# Patient Record
Sex: Female | Born: 1990 | Race: Black or African American | Hispanic: No | Marital: Single | Smoking: Current every day smoker
Health system: Southern US, Community
[De-identification: ages and names within clinical notes are randomized; demographics above are authoritative.]

## PROBLEM LIST (undated history)

## (undated) ENCOUNTER — Inpatient Hospital Stay (HOSPITAL_COMMUNITY): Payer: Self-pay

## (undated) ENCOUNTER — Ambulatory Visit

## (undated) DIAGNOSIS — B999 Unspecified infectious disease: Secondary | ICD-10-CM

## (undated) DIAGNOSIS — Z98891 History of uterine scar from previous surgery: Secondary | ICD-10-CM

## (undated) DIAGNOSIS — S3991XA Unspecified injury of abdomen, initial encounter: Secondary | ICD-10-CM

## (undated) DIAGNOSIS — A749 Chlamydial infection, unspecified: Secondary | ICD-10-CM

## (undated) DIAGNOSIS — N83209 Unspecified ovarian cyst, unspecified side: Secondary | ICD-10-CM

## (undated) DIAGNOSIS — Z3490 Encounter for supervision of normal pregnancy, unspecified, unspecified trimester: Secondary | ICD-10-CM

## (undated) HISTORY — PX: NO PAST SURGERIES: SHX2092

---
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
## 2010-06-02 ENCOUNTER — Ambulatory Visit: Payer: Self-pay | Admitting: Gynecology

## 2010-06-09 ENCOUNTER — Ambulatory Visit: Payer: Self-pay | Admitting: Gynecology

## 2010-09-13 ENCOUNTER — Ambulatory Visit: Payer: Self-pay | Admitting: Gynecology

## 2010-09-14 ENCOUNTER — Ambulatory Visit: Payer: Self-pay | Admitting: Gynecology

## 2010-09-20 ENCOUNTER — Ambulatory Visit: Payer: Self-pay | Admitting: Gynecology

## 2010-12-10 ENCOUNTER — Ambulatory Visit
Admission: RE | Admit: 2010-12-10 | Discharge: 2010-12-10 | Payer: Self-pay | Source: Home / Self Care | Attending: Gynecology | Admitting: Gynecology

## 2010-12-22 ENCOUNTER — Ambulatory Visit: Admit: 2010-12-22 | Payer: Self-pay | Admitting: Gynecology

## 2011-02-08 ENCOUNTER — Ambulatory Visit (INDEPENDENT_AMBULATORY_CARE_PROVIDER_SITE_OTHER): Payer: BC Managed Care – PPO | Admitting: Gynecology

## 2011-02-08 DIAGNOSIS — B373 Candidiasis of vulva and vagina: Secondary | ICD-10-CM

## 2011-02-08 DIAGNOSIS — Z113 Encounter for screening for infections with a predominantly sexual mode of transmission: Secondary | ICD-10-CM

## 2011-02-08 DIAGNOSIS — N949 Unspecified condition associated with female genital organs and menstrual cycle: Secondary | ICD-10-CM

## 2011-02-08 DIAGNOSIS — N898 Other specified noninflammatory disorders of vagina: Secondary | ICD-10-CM

## 2011-05-02 ENCOUNTER — Ambulatory Visit (INDEPENDENT_AMBULATORY_CARE_PROVIDER_SITE_OTHER): Payer: BC Managed Care – PPO | Admitting: Gynecology

## 2011-05-02 DIAGNOSIS — Z113 Encounter for screening for infections with a predominantly sexual mode of transmission: Secondary | ICD-10-CM

## 2011-05-02 DIAGNOSIS — N949 Unspecified condition associated with female genital organs and menstrual cycle: Secondary | ICD-10-CM

## 2011-05-02 DIAGNOSIS — Z3041 Encounter for surveillance of contraceptive pills: Secondary | ICD-10-CM

## 2011-05-03 ENCOUNTER — Ambulatory Visit (INDEPENDENT_AMBULATORY_CARE_PROVIDER_SITE_OTHER): Payer: BC Managed Care – PPO | Admitting: Gynecology

## 2011-05-03 ENCOUNTER — Other Ambulatory Visit: Payer: BC Managed Care – PPO

## 2011-05-03 DIAGNOSIS — N949 Unspecified condition associated with female genital organs and menstrual cycle: Secondary | ICD-10-CM

## 2011-05-03 DIAGNOSIS — N831 Corpus luteum cyst of ovary, unspecified side: Secondary | ICD-10-CM

## 2011-07-15 ENCOUNTER — Telehealth: Payer: Self-pay | Admitting: *Deleted

## 2011-07-15 DIAGNOSIS — N946 Dysmenorrhea, unspecified: Secondary | ICD-10-CM

## 2011-07-15 MED ORDER — TRAMADOL HCL 50 MG PO TABS: 50.0000 mg | ORAL_TABLET | Freq: Four times a day (QID) | ORAL | Status: AC | PRN

## 2011-07-15 NOTE — Telephone Encounter (Signed)
We can try Ultram 50 mg every 6 hours #20. If her pain persists and this doesn't help that she needs to proceed with laparoscopy and make an appointment to discuss this.

## 2011-07-15 NOTE — Telephone Encounter (Signed)
PT INFORMED WITH THE BELOW NOTE RE;RX AND LAPAROSCOPY AND TO MAKE APPOINTMENT TO DISCUSS.

## 2011-07-15 NOTE — Telephone Encounter (Signed)
PT C/O THAT MOTRIN 800 MG IS NOT HELPING WITH CRAMPING. LMP;TODAY. PT STATES CRAMPS ARE VERY BAD WITH PERIOD. SHE IS TAKING HER BCP AS PRESCRIBED. PLEASE ADVISE.

## 2011-07-15 NOTE — Telephone Encounter (Signed)
PT STATES THAT THE 800 MG MOTRIN NOT STRONG ENOUGH FOR CRAMPS.

## 2011-08-09 ENCOUNTER — Telehealth: Payer: Self-pay | Admitting: *Deleted

## 2011-08-09 DIAGNOSIS — IMO0001 Reserved for inherently not codable concepts without codable children: Secondary | ICD-10-CM

## 2011-08-09 MED ORDER — NORETHIN ACE-ETH ESTRAD-FE 1-20 MG-MCG PO TABS: 1.0000 | ORAL_TABLET | Freq: Every day | ORAL | Status: DC

## 2011-08-09 NOTE — Telephone Encounter (Signed)
PT CALLED WANTING LOESTRIN 1/20 BCP SENT TO [facility] [address]. RATHER THAT [facility]. RX SENT TO [facility]

## 2011-08-26 ENCOUNTER — Telehealth: Payer: Self-pay | Admitting: *Deleted

## 2011-08-26 NOTE — Telephone Encounter (Signed)
Pt complaint of breast tenderness from 3 row of birth control pills. Pt informed that this is normal. Pt will follow up if this should continue after her period starts.

## 2011-08-30 LAB — URINE MICROSCOPIC-ADD ON

## 2011-08-30 LAB — HERPES SIMPLEX VIRUS CULTURE: Culture: NOT DETECTED

## 2011-08-30 LAB — URINALYSIS, ROUTINE W REFLEX MICROSCOPIC
Bilirubin Urine: NEGATIVE
Glucose, UA: NEGATIVE
Ketones, ur: NEGATIVE
Nitrite: NEGATIVE
Protein, ur: NEGATIVE
Specific Gravity, Urine: 1.034 — ABNORMAL HIGH
Urobilinogen, UA: 1
pH: 7

## 2011-08-30 LAB — WET PREP, GENITAL
Clue Cells Wet Prep HPF POC: NONE SEEN
Trich, Wet Prep: NONE SEEN

## 2011-08-30 LAB — GC/CHLAMYDIA PROBE AMP, GENITAL
Chlamydia, DNA Probe: POSITIVE — AB
GC Probe Amp, Genital: NEGATIVE

## 2011-08-30 LAB — PREGNANCY, URINE: Preg Test, Ur: NEGATIVE

## 2011-09-02 LAB — WET PREP, GENITAL: Clue Cells Wet Prep HPF POC: NONE SEEN

## 2011-09-02 LAB — URINALYSIS, ROUTINE W REFLEX MICROSCOPIC
Glucose, UA: NEGATIVE mg/dL
Ketones, ur: NEGATIVE mg/dL
Protein, ur: NEGATIVE mg/dL
Urobilinogen, UA: 0.2 mg/dL (ref 0.0–1.0)

## 2011-09-02 LAB — GC/CHLAMYDIA PROBE AMP, GENITAL: Chlamydia, DNA Probe: NEGATIVE

## 2011-09-02 LAB — URINE MICROSCOPIC-ADD ON

## 2011-11-03 ENCOUNTER — Telehealth: Payer: Self-pay | Admitting: *Deleted

## 2011-11-03 ENCOUNTER — Encounter: Payer: Self-pay | Admitting: Gynecology

## 2011-11-03 ENCOUNTER — Ambulatory Visit (INDEPENDENT_AMBULATORY_CARE_PROVIDER_SITE_OTHER): Payer: BC Managed Care – PPO | Admitting: Gynecology

## 2011-11-03 VITALS — BP 112/64

## 2011-11-03 DIAGNOSIS — N912 Amenorrhea, unspecified: Secondary | ICD-10-CM

## 2011-11-03 NOTE — Telephone Encounter (Signed)
Pt informed with the below note. 

## 2011-11-03 NOTE — Patient Instructions (Signed)
Started on a prenatal vitamin. Establish care with an obstetrician in [place] over the next 2-3 weeks.

## 2011-11-03 NOTE — Telephone Encounter (Signed)
Pt called stating she took a pregnancy test yesterday and results were positive,pt period is 5 days late. She also noted having bad cramping at night only x 5 days on her left side. She going to schedule OV but would like to know if she could have ultrasound as well? Due to the pelvic pain. Please advise.

## 2011-11-03 NOTE — Progress Notes (Signed)
Patient presents approximately [redacted] weeks gestation positive home pregnancy test. No bleeding or significant cramping.  Exam with chaperone present Abdomen soft nontender without masses guarding rebound organomegaly. Pelvic external BUS vagina normal, cervix normal, uterus soft, nontender anteverted approximately 5 weeks size, adnexa without masses or tenderness  Assessment and plan: IUP approximately 5 weeks. Patient is not on multivitamins and I strongly urge her to start a prenatal vitamin. She understands we do not do obstetrics and she will need to establish care with obstetrician in [place] and I recommend she do so over the next 2-3 weeks.

## 2011-11-03 NOTE — Telephone Encounter (Signed)
Office visit first and then we'll decide about ultrasound as far as to need to do it as well as when we need to do it. If you do it too early you'll not see anything inside the uterus so it has to be done at the appropriate time.

## 2011-11-14 ENCOUNTER — Telehealth: Payer: Self-pay | Admitting: *Deleted

## 2011-11-14 NOTE — Telephone Encounter (Signed)
(  pt last ov 11/03/11) Pt called is [redacted] weeks pregnant and is having some tingling in her right arm starting today for about last 30 mins? No other complains only tingling. Pt couldn't get in to her OB/GYN office yet. Should pt stop by urgent care to be seen?

## 2011-11-14 NOTE — Telephone Encounter (Signed)
Pt informed with the below note. 

## 2011-11-14 NOTE — Telephone Encounter (Signed)
I would recommend that she call the obstetrician that she is choosing, let them know what is going on and see if they cannot see her sooner.

## 2011-12-01 ENCOUNTER — Inpatient Hospital Stay (HOSPITAL_COMMUNITY)
Admission: AD | Admit: 2011-12-01 | Discharge: 2011-12-01 | Disposition: A | Discharge status: Home / Self Care | Payer: BC Managed Care – PPO | Source: Ambulatory Visit | Attending: Obstetrics and Gynecology | Admitting: Obstetrics and Gynecology

## 2011-12-01 ENCOUNTER — Inpatient Hospital Stay (HOSPITAL_COMMUNITY): Payer: BC Managed Care – PPO

## 2011-12-01 ENCOUNTER — Encounter (HOSPITAL_COMMUNITY): Payer: Self-pay

## 2011-12-01 DIAGNOSIS — N760 Acute vaginitis: Secondary | ICD-10-CM | POA: Insufficient documentation

## 2011-12-01 DIAGNOSIS — O26899 Other specified pregnancy related conditions, unspecified trimester: Secondary | ICD-10-CM

## 2011-12-01 DIAGNOSIS — B9689 Other specified bacterial agents as the cause of diseases classified elsewhere: Secondary | ICD-10-CM | POA: Insufficient documentation

## 2011-12-01 DIAGNOSIS — R109 Unspecified abdominal pain: Secondary | ICD-10-CM | POA: Insufficient documentation

## 2011-12-01 DIAGNOSIS — O2390 Unspecified genitourinary tract infection in pregnancy, unspecified trimester: Secondary | ICD-10-CM | POA: Insufficient documentation

## 2011-12-01 DIAGNOSIS — A499 Bacterial infection, unspecified: Secondary | ICD-10-CM | POA: Insufficient documentation

## 2011-12-01 DIAGNOSIS — K5900 Constipation, unspecified: Secondary | ICD-10-CM | POA: Insufficient documentation

## 2011-12-01 HISTORY — DX: Unspecified ovarian cyst, unspecified side: N83.209

## 2011-12-01 LAB — CBC
Hemoglobin: 12.8 g/dL (ref 12.0–15.0)
MCH: 30.6 pg (ref 26.0–34.0)
MCHC: 35 g/dL (ref 30.0–36.0)
RDW: 12.6 % (ref 11.5–15.5)

## 2011-12-01 LAB — URINALYSIS, ROUTINE W REFLEX MICROSCOPIC
Bilirubin Urine: NEGATIVE
Glucose, UA: NEGATIVE mg/dL
Ketones, ur: NEGATIVE mg/dL
Leukocytes, UA: NEGATIVE
Nitrite: NEGATIVE
Protein, ur: NEGATIVE mg/dL
Specific Gravity, Urine: 1.015 (ref 1.005–1.030)
Urobilinogen, UA: 0.2 mg/dL (ref 0.0–1.0)
pH: 6 (ref 5.0–8.0)

## 2011-12-01 LAB — WET PREP, GENITAL
Trich, Wet Prep: NONE SEEN
Yeast Wet Prep HPF POC: NONE SEEN

## 2011-12-01 LAB — URINE MICROSCOPIC-ADD ON

## 2011-12-01 LAB — HCG, QUANTITATIVE, PREGNANCY: hCG, Beta Chain, Quant, S: 122461 m[IU]/mL — ABNORMAL HIGH (ref <5)

## 2011-12-01 MED ORDER — METRONIDAZOLE 500 MG PO TABS: 500.0000 mg | ORAL_TABLET | Freq: Two times a day (BID) | ORAL | Status: AC

## 2011-12-01 NOTE — Progress Notes (Signed)
Pt states lower abd pain over 2 weeks. Vaginal discharge noted

## 2011-12-01 NOTE — Progress Notes (Signed)
Patient states she has been having abdominal pain on both sides at the mid level for over 2 weeks.Pain is worse at night.  Was seen at Urgent Care at that time but pain continues. Patient states she has been having a clear vaginal discharge with an odor since she found out she was pregnant. Had a positive pregnancy test with Dr. Marva Panda office and states he had diagnosed an ovarian cyst on the left side a while ago.

## 2011-12-01 NOTE — ED Provider Notes (Signed)
History     Chief Complaint  Patient presents with  . Abdominal Pain  . Vaginal Discharge   HPI  Pt is pregnant -9weeks by LMP 09/29/2011 - with history of irregular periods.  She has a history of a cyst.  She has previously seen Dr. Audie Box in her early pregnancy, but has not pursued OB care yet.  She is to see Dr. Gaynell Face.  She complains of lower abdominal pain worse on her left side and worse at night.  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  She has had some nausea and vomiting.  She has not taken any medication for the pain or nausea. She also complains of constipation but had a soft bowel movement yesterday.  She does not use the bathroom everyday. She was seen at Urgent care 2 weeks ago for the abdominal pain, but she states they did not do an evaluation.  She complains of a vaginal discharge with an odor, that she noticed before she was pregnant.  No past medical history on file.  No past surgical history on file.  Family History  Problem Relation Age of Onset  . Diabetes Paternal Grandmother     History  Substance Use Topics  . Smoking status: Never Smoker   . Smokeless tobacco: Not on file  . Alcohol Use: No    Allergies: No Known Allergies  Prescriptions prior to admission  Medication Sig Dispense Refill  . norethindrone-ethinyl estradiol (JUNEL FE,GILDESS FE,LOESTRIN FE) 1-20 MG-MCG tablet Take 1 tablet by mouth daily.  28 Package  4    Review of Systems  Constitutional: Negative for fever and chills.  HENT:       Denies blurred vision- occur bilateral temporal  Gastrointestinal: Positive for nausea, vomiting, abdominal pain and constipation. Negative for diarrhea.  Genitourinary: Negative for dysuria.  Neurological: Positive for headaches.   Physical Exam   Blood pressure 124/69, pulse 86, temperature 99 F (37.2 C), temperature source Oral, resp. rate 16, height 5' (1.524 m), weight 147 lb 3.2 oz (66.769 kg), last menstrual period  09/29/2011, SpO2 99.00%.  Physical Exam  Vitals reviewed. Constitutional: She is oriented to person, place, and time. She appears well-developed and well-nourished.  HENT:  Head: Normocephalic.  Eyes: Pupils are equal, round, and reactive to light.  Neck: Normal range of motion. Neck supple.  Cardiovascular: Normal rate.   Respiratory: Effort normal.  GI: Soft. She exhibits no distension. There is no tenderness. There is no rebound and no guarding.  Genitourinary:       Small amount of frothy white discharge in vault; cervix clean NT, uterus 8-10 week size nontender; adnexal without palpable enlargement or tenderness  Musculoskeletal: Normal range of motion.  Neurological: She is alert and oriented to person, place, and time.  Skin: Skin is warm and dry.  Psychiatric: She has a normal mood and affect.    MAU Course  Procedures Results for orders placed during the hospital encounter of 12/01/11 (from the past 24 hour(s))  URINALYSIS, ROUTINE W REFLEX MICROSCOPIC     Status: Abnormal   Collection Time   12/01/11  9:34 AM      Component Value Range   Color, Urine YELLOW  YELLOW    APPearance CLEAR  CLEAR    Specific Gravity, Urine 1.015  1.005 - 1.030    pH 6.0  5.0 - 8.0    Glucose, UA NEGATIVE  NEGATIVE (mg/dL)   Hgb urine dipstick TRACE (*) NEGATIVE  Bilirubin Urine NEGATIVE  NEGATIVE    Ketones, ur NEGATIVE  NEGATIVE (mg/dL)   Protein, ur NEGATIVE  NEGATIVE (mg/dL)   Urobilinogen, UA 0.2  0.0 - 1.0 (mg/dL)   Nitrite NEGATIVE  NEGATIVE    Leukocytes, UA NEGATIVE  NEGATIVE   URINE MICROSCOPIC-ADD ON     Status: Normal   Collection Time   12/01/11  9:34 AM      Component Value Range   Squamous Epithelial / LPF RARE  RARE    WBC, UA 0-2  <3 (WBC/hpf)   RBC / HPF 0-2  <3 (RBC/hpf)   Bacteria, UA RARE  RARE   CBC     Status: Normal   Collection Time   12/01/11  9:48 AM      Component Value Range   WBC 8.3  4.0 - 10.5 (K/uL)   RBC 4.18  3.87 - 5.11 (MIL/uL)   Hemoglobin  12.8  12.0 - 15.0 (g/dL)   HCT 09.8  11.9 - 14.7 (%)   MCV 87.6  78.0 - 100.0 (fL)   MCH 30.6  26.0 - 34.0 (pg)   MCHC 35.0  30.0 - 36.0 (g/dL)   RDW 82.9  56.2 - 13.0 (%)   Platelets 264  150 - 400 (K/uL)  HCG, QUANTITATIVE, PREGNANCY     Status: Abnormal   Collection Time   12/01/11  9:50 AM      Component Value Range   hCG, Beta Chain, Quant, S [id number] (*) <5 (mIU/mL)  WET PREP, GENITAL     Status: Abnormal   Collection Time   12/01/11 11:52 AM      Component Value Range   Yeast, Wet Prep NONE SEEN  NONE SEEN    Trich, Wet Prep NONE SEEN  NONE SEEN    Clue Cells, Wet Prep FEW (*) NONE SEEN    WBC, Wet Prep HPF POC FEW (*) NONE SEEN   ultrasound showed single living IUP demonstrating on EGA by CRL of 103w3d.  This correlates well with expected EGA by LMP of [redacted]w[redacted]d.  Normal ovaries    Assessment and Plan  Abdominal pain in pregnancy Constipation BV- will give prescription for Flagyl F/u with OB care  Sherel Fennell 12/01/2011, 9:51 AM

## 2011-12-02 LAB — GC/CHLAMYDIA PROBE AMP, GENITAL
Chlamydia, DNA Probe: NEGATIVE
GC Probe Amp, Genital: NEGATIVE

## 2011-12-05 NOTE — ED Provider Notes (Signed)
Agree with above note.  Katherine Robbins 12/05/2011 10:04 AM   

## 2011-12-29 LAB — OB RESULTS CONSOLE ANTIBODY SCREEN: Antibody Screen: NEGATIVE

## 2011-12-29 LAB — OB RESULTS CONSOLE ABO/RH: RH Type: NEGATIVE

## 2011-12-29 LAB — OB RESULTS CONSOLE RPR: RPR: NONREACTIVE

## 2011-12-29 LAB — OB RESULTS CONSOLE RUBELLA ANTIBODY, IGM: Rubella: IMMUNE

## 2011-12-29 LAB — OB RESULTS CONSOLE VARICELLA ZOSTER ANTIBODY, IGG: Varicella: IMMUNE

## 2011-12-29 LAB — OB RESULTS CONSOLE HEPATITIS B SURFACE ANTIGEN: Hepatitis B Surface Ag: NEGATIVE

## 2011-12-29 LAB — OB RESULTS CONSOLE HIV ANTIBODY (ROUTINE TESTING): HIV: NONREACTIVE

## 2011-12-30 ENCOUNTER — Other Ambulatory Visit (HOSPITAL_COMMUNITY): Payer: Self-pay | Admitting: Obstetrics and Gynecology

## 2011-12-30 DIAGNOSIS — Z3682 Encounter for antenatal screening for nuchal translucency: Secondary | ICD-10-CM

## 2012-01-02 ENCOUNTER — Other Ambulatory Visit: Payer: Self-pay

## 2012-01-02 ENCOUNTER — Ambulatory Visit (HOSPITAL_COMMUNITY)
Admission: RE | Admit: 2012-01-02 | Discharge: 2012-01-02 | Disposition: A | Discharge status: Home / Self Care | Payer: Medicaid Other | Source: Ambulatory Visit | Attending: Obstetrics and Gynecology | Admitting: Obstetrics and Gynecology

## 2012-01-02 VITALS — BP 116/74 | HR 97 | Wt 150.0 lb

## 2012-01-02 DIAGNOSIS — O351XX0 Maternal care for (suspected) chromosomal abnormality in fetus, not applicable or unspecified: Secondary | ICD-10-CM | POA: Insufficient documentation

## 2012-01-02 DIAGNOSIS — Z3682 Encounter for antenatal screening for nuchal translucency: Secondary | ICD-10-CM

## 2012-01-02 DIAGNOSIS — O3510X0 Maternal care for (suspected) chromosomal abnormality in fetus, unspecified, not applicable or unspecified: Secondary | ICD-10-CM | POA: Insufficient documentation

## 2012-01-02 DIAGNOSIS — Z3689 Encounter for other specified antenatal screening: Secondary | ICD-10-CM | POA: Insufficient documentation

## 2012-01-02 NOTE — Progress Notes (Signed)
Obstetric ultrasound performed today.  Nuchal translucency measurements reassuring.  Please see report in [facility].

## 2012-01-28 ENCOUNTER — Inpatient Hospital Stay (HOSPITAL_COMMUNITY)
Admission: AD | Admit: 2012-01-28 | Discharge: 2012-01-28 | Disposition: A | Discharge status: Home / Self Care | Payer: Medicaid Other | Source: Ambulatory Visit | Attending: Obstetrics and Gynecology | Admitting: Obstetrics and Gynecology

## 2012-01-28 ENCOUNTER — Encounter (HOSPITAL_COMMUNITY): Payer: Self-pay | Admitting: Obstetrics and Gynecology

## 2012-01-28 DIAGNOSIS — O99891 Other specified diseases and conditions complicating pregnancy: Secondary | ICD-10-CM | POA: Insufficient documentation

## 2012-01-28 DIAGNOSIS — N949 Unspecified condition associated with female genital organs and menstrual cycle: Secondary | ICD-10-CM

## 2012-01-28 DIAGNOSIS — R109 Unspecified abdominal pain: Secondary | ICD-10-CM | POA: Insufficient documentation

## 2012-01-28 LAB — CBC
HCT: 35.6 % — ABNORMAL LOW (ref 36.0–46.0)
Hemoglobin: 12.1 g/dL (ref 12.0–15.0)
MCH: 30.2 pg (ref 26.0–34.0)
RBC: 4.01 MIL/uL (ref 3.87–5.11)

## 2012-01-28 LAB — URINALYSIS, ROUTINE W REFLEX MICROSCOPIC
Bilirubin Urine: NEGATIVE
Glucose, UA: NEGATIVE mg/dL
Leukocytes, UA: NEGATIVE
Nitrite: NEGATIVE
Specific Gravity, Urine: 1.015 (ref 1.005–1.030)
pH: 7 (ref 5.0–8.0)

## 2012-01-28 MED ORDER — COMFORT FIT MATERNITY SUPP MED MISC: 1.0000 | Freq: Every day | Status: DC

## 2012-01-28 NOTE — Progress Notes (Signed)
Lower abdominal pain x 2 weeks has been hurting all night, G1  17 weeks.

## 2012-01-28 NOTE — ED Provider Notes (Signed)
Jonathon Resides y.o.G1P0000 @[redacted]w[redacted]d  Chief Complaint  Patient presents with  . Abdominal Pain    SUBJECTIVE  HPI: Pt presents with abdominal pain, indicated as suprapubic and left lateral abdominal. She has had left sided pain throughout pregnancy but pain was worse last night, making it difficult to sleep.  Pain is described as constant pain that worsens a couple of times per hour.  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  She denies vaginal bleeding, LOF, vaginal itching/discharge, fever/chills, h/a, dizziness, or n/v.    Past Medical History  Diagnosis Date  . Ovarian cyst    Past Surgical History  Procedure Date  . No past surgeries    History   Social History  . Marital Status: Single    Spouse Name: N/A    Number of Children: N/A  . Years of Education: N/A   Occupational History  . Not on file.   Social History Main Topics  . Smoking status: Never Smoker   . Smokeless tobacco: Never Used  . Alcohol Use: No  . Drug Use: No  . Sexually Active: Yes    Birth Control/ Protection: None   Other Topics Concern  . Not on file   Social History Narrative  . No narrative on file   No current facility-administered medications on file prior to encounter.   Current Outpatient Prescriptions on File Prior to Encounter  Medication Sig Dispense Refill  . PRENATAL VITAMINS PO Take by mouth.       Allergies  Allergen Reactions  . Shellfish Allergy Itching and Swelling    ROS: Pertinent items in HPI  OBJECTIVE See intake temp and temp retake below: Temp:  [98.6 F (37 C)-100.1 F (37.8 C)] 98.7 F (37.1 C) (03/02 1100) Pulse Rate:  [95-104] 95  (03/02 1100) Resp:  [18] 18  (03/02 1100) BP: (117-121)/(70-76) 121/70 mmHg (03/02 1100) Weight:  [69.491 kg (153 lb 3.2 oz)] 69.491 kg (153 lb 3.2 oz) (03/02 0814)  GENERAL: Well-developed, well-nourished female in no acute distress.  LUNGS: Clear to auscultation  bilaterally.  HEART: Regular rate and rhythm. ABDOMEN: Soft, mildly tender in suprapubic and lower abdominal area and in left inguinal region EXTREMITIES: Nontender, no edema Pelvic exam: Deferred Bimanual exam: 0/long/high  Neg CVA tenderness Neg rebounding or guarding  FHR:151 by doppler CONTRACTIONS: None palpated  RESULTS Results for orders placed during the hospital encounter of 01/28/12 (from the past 24 hour(s))  URINALYSIS, ROUTINE W REFLEX MICROSCOPIC     Status: Abnormal   Collection Time   01/28/12  8:15 AM      Component Value Range   Color, Urine YELLOW  YELLOW    APPearance HAZY (*) CLEAR    Specific Gravity, Urine 1.015  1.005 - 1.030    pH 7.0  5.0 - 8.0    Glucose, UA NEGATIVE  NEGATIVE (mg/dL)   Hgb urine dipstick NEGATIVE  NEGATIVE    Bilirubin Urine NEGATIVE  NEGATIVE    Ketones, ur NEGATIVE  NEGATIVE (mg/dL)   Protein, ur NEGATIVE  NEGATIVE (mg/dL)   Urobilinogen, UA 0.2  0.0 - 1.0 (mg/dL)   Nitrite NEGATIVE  NEGATIVE    Leukocytes, UA NEGATIVE  NEGATIVE   CBC     Status: Abnormal   Collection Time   01/28/12  9:41 AM      Component Value Range   WBC 10.0  4.0 - 10.5 (K/uL)   RBC 4.01  3.87 - 5.11 (MIL/uL)  Hemoglobin 12.1  12.0 - 15.0 (g/dL)   HCT 40.9 (*) 81.1 - 46.0 (%)   MCV 88.8  78.0 - 100.0 (fL)   MCH 30.2  26.0 - 34.0 (pg)   MCHC 34.0  30.0 - 36.0 (g/dL)   RDW 91.4  78.2 - 95.6 (%)   Platelets 246  150 - 400 (K/uL)    IMAGING Not indicated  MANAGEMENT Called Dr Jackelyn Knife to review results.  He agrees with POC to d/c home with f/u this week.    ASSESSMENT Round ligament pain  PLAN D/C home  Prescription for maternity support belt F/U with Dr Jackelyn Knife on Monday Return to MAU as needed

## 2012-01-28 NOTE — Discharge Instructions (Signed)

## 2012-03-21 ENCOUNTER — Encounter (HOSPITAL_COMMUNITY): Payer: Self-pay | Admitting: *Deleted

## 2012-03-21 ENCOUNTER — Inpatient Hospital Stay (HOSPITAL_COMMUNITY)
Admission: AD | Admit: 2012-03-21 | Discharge: 2012-03-21 | Disposition: A | Discharge status: Home / Self Care | Payer: Medicaid Other | Source: Ambulatory Visit | Attending: Obstetrics and Gynecology | Admitting: Obstetrics and Gynecology

## 2012-03-21 DIAGNOSIS — O219 Vomiting of pregnancy, unspecified: Secondary | ICD-10-CM

## 2012-03-21 DIAGNOSIS — R197 Diarrhea, unspecified: Secondary | ICD-10-CM

## 2012-03-21 DIAGNOSIS — K5289 Other specified noninfective gastroenteritis and colitis: Secondary | ICD-10-CM | POA: Insufficient documentation

## 2012-03-21 DIAGNOSIS — O212 Late vomiting of pregnancy: Secondary | ICD-10-CM | POA: Insufficient documentation

## 2012-03-21 DIAGNOSIS — O99891 Other specified diseases and conditions complicating pregnancy: Secondary | ICD-10-CM | POA: Insufficient documentation

## 2012-03-21 LAB — URINALYSIS, ROUTINE W REFLEX MICROSCOPIC
Bilirubin Urine: NEGATIVE
Ketones, ur: 15 mg/dL — AB
Protein, ur: 100 mg/dL — AB
Specific Gravity, Urine: 1.025 (ref 1.005–1.030)
Urobilinogen, UA: 1 mg/dL (ref 0.0–1.0)

## 2012-03-21 LAB — COMPREHENSIVE METABOLIC PANEL
ALT: 16 U/L (ref 0–35)
AST: 39 U/L — ABNORMAL HIGH (ref 0–37)
CO2: 19 mEq/L (ref 19–32)
Chloride: 101 mEq/L (ref 96–112)
Creatinine, Ser: 0.46 mg/dL — ABNORMAL LOW (ref 0.50–1.10)
GFR calc Af Amer: >90 mL/min (ref >90)
GFR calc non Af Amer: >90 mL/min (ref >90)
Glucose, Bld: 89 mg/dL (ref 70–99)
Total Bilirubin: 0.3 mg/dL (ref 0.3–1.2)

## 2012-03-21 LAB — CBC
Hemoglobin: 12 g/dL (ref 12.0–15.0)
MCV: 91 fL (ref 78.0–100.0)
Platelets: 246 10*3/uL (ref 150–400)
RBC: 3.9 MIL/uL (ref 3.87–5.11)
WBC: 16.9 10*3/uL — ABNORMAL HIGH (ref 4.0–10.5)

## 2012-03-21 LAB — URINE MICROSCOPIC-ADD ON

## 2012-03-21 MED ORDER — ONDANSETRON 8 MG PO TBDP: 8.0000 mg | ORAL_TABLET | Freq: Three times a day (TID) | ORAL | Status: AC | PRN

## 2012-03-21 MED ORDER — PROMETHAZINE HCL 25 MG/ML IJ SOLN
12.5000 mg | Freq: Once | INTRAMUSCULAR | Status: AC
  Administered 2012-03-21: 12.5 mg via INTRAVENOUS
  Filled 2012-03-21: qty 1

## 2012-03-21 MED ORDER — LACTATED RINGERS IV BOLUS (SEPSIS)
1000.0000 mL | Freq: Once | INTRAVENOUS | Status: AC
  Administered 2012-03-21: 1000 mL via INTRAVENOUS

## 2012-03-21 MED ORDER — PROMETHAZINE HCL 25 MG PO TABS: 25.0000 mg | ORAL_TABLET | Freq: Four times a day (QID) | ORAL | Status: DC | PRN

## 2012-03-21 MED ORDER — ONDANSETRON HCL 4 MG/2ML IJ SOLN
4.0000 mg | Freq: Once | INTRAMUSCULAR | Status: AC
  Administered 2012-03-21: 4 mg via INTRAVENOUS
  Filled 2012-03-21: qty 2

## 2012-03-21 NOTE — MAU Note (Signed)
Feeling better, still slightly nauseated, no adverse effect from phenergan

## 2012-03-21 NOTE — MAU Provider Note (Signed)
History     CSN: [id number]  Arrival date and time: 03/21/12 0536   None     No chief complaint on file.  HPI 21 y.o. G1P0000 at [redacted]w[redacted]d with n/v since 2200 last night, 6 episodes, not able to keep anything down overnight, 1 episode of diarrhea. Uncomplicated prenatal course so far, except for "really bad heartburn". C/O abdominal tightening "every once in a while" since onset of vomiting, no bleeding or discharge.    Past Medical History  Diagnosis Date  . Ovarian cyst     Past Surgical History  Procedure Date  . No past surgeries     Family History  Problem Relation Age of Onset  . Diabetes Paternal Grandmother     History  Substance Use Topics  . Smoking status: Never Smoker   . Smokeless tobacco: Never Used  . Alcohol Use: No    Allergies:  Allergies  Allergen Reactions  . Shellfish Allergy Itching and Swelling    Prescriptions prior to admission  Medication Sig Dispense Refill  . Elastic Bandages & Supports (COMFORT FIT MATERNITY SUPP MED) MISC 1 Device by Does not apply route daily.  1 each  0  . PRENATAL VITAMINS PO Take by mouth.        Review of Systems  Constitutional: Negative.   Respiratory: Negative.   Cardiovascular: Negative.   Gastrointestinal: Positive for nausea, vomiting, abdominal pain and diarrhea. Negative for constipation.  Genitourinary: Negative for dysuria, urgency, frequency, hematuria and flank pain.       Negative for vaginal bleeding, cramping/contractions  Musculoskeletal: Negative.   Neurological: Negative.   Psychiatric/Behavioral: Negative.    Physical Exam   Last menstrual period 09/29/2011.  Physical Exam  Nursing note and vitals reviewed. Constitutional: She is oriented to person, place, and time. She appears well-developed and well-nourished. No distress.  Cardiovascular: Normal rate.   Respiratory: Effort normal.  GI: Soft. She exhibits no mass. There is no tenderness. There is no rebound and no guarding.    Musculoskeletal: Normal range of motion.  Neurological: She is alert and oriented to person, place, and time.  Skin: Skin is warm and dry.  Psychiatric: She has a normal mood and affect.   EFM: reassuring at 25 weeks, TOCO: quiet  MAU Course  Procedures  Results for orders placed during the hospital encounter of 03/21/12 (from the past 24 hour(s))  URINALYSIS, ROUTINE W REFLEX MICROSCOPIC     Status: Abnormal   Collection Time   03/21/12  5:45 AM      Component Value Range   Color, Urine YELLOW  YELLOW    APPearance CLEAR  CLEAR    Specific Gravity, Urine 1.025  1.005 - 1.030    pH 6.0  5.0 - 8.0    Glucose, UA NEGATIVE  NEGATIVE (mg/dL)   Hgb urine dipstick TRACE (*) NEGATIVE    Bilirubin Urine NEGATIVE  NEGATIVE    Ketones, ur 15 (*) NEGATIVE (mg/dL)   Protein, ur 657 (*) NEGATIVE (mg/dL)   Urobilinogen, UA 1.0  0.0 - 1.0 (mg/dL)   Nitrite NEGATIVE  NEGATIVE    Leukocytes, UA NEGATIVE  NEGATIVE   URINE MICROSCOPIC-ADD ON     Status: Normal   Collection Time   03/21/12  5:45 AM      Component Value Range   Squamous Epithelial / LPF RARE  RARE    WBC, UA 0-2  <3 (WBC/hpf)   Bacteria, UA RARE  RARE    Urine-Other MUCOUS PRESENT  IV hydration and Zofran 4 mg IV given, pt continues to c/o nausea and vomiting, Phenergan 12.5 mg IV given and 2nd liter of fluid hung.   Assessment and Plan  21 y.o. G1P0000 at [redacted]w[redacted]d Gastroenteritis - IV hydration and antiemetics Dr. Jackelyn Knife aware, may send home after hydration with rx for antiemetics if stable Care assumed by Charlestine Night at 0800  Plan Feeling much better after fluids and ready to go home BRAT diet today. rx Phenergan 25 mg One po q 4-6 hours (#25)  rx Zofran 8 mg ODT one sublingual q 8 hours PRN for nausea Call your doctor if you have questions or concerns   FRAZIER,NATALIE 03/21/2012, 5:37 AM

## 2012-03-21 NOTE — Progress Notes (Signed)
Labs drawn and sent -

## 2012-03-21 NOTE — MAU Note (Signed)
Pt states she  Has been vomiting since 2200 last night-vomited approx 6 times

## 2012-03-21 NOTE — Discharge Instructions (Signed)
Clear liquids until no vomiting then BRAT diet today and advance slowly as tolerated No fried foods today. Call your doctor if you have any questions or concerns. Keep your appointments as scheduled in the office.

## 2012-04-03 ENCOUNTER — Institutional Professional Consult (permissible substitution): Payer: BC Managed Care – PPO | Admitting: Pediatrics

## 2012-04-06 ENCOUNTER — Encounter (HOSPITAL_COMMUNITY): Payer: Self-pay | Admitting: *Deleted

## 2012-04-06 ENCOUNTER — Inpatient Hospital Stay (HOSPITAL_COMMUNITY)
Admission: AD | Admit: 2012-04-06 | Discharge: 2012-04-06 | Disposition: A | Discharge status: Home / Self Care | Payer: Medicaid Other | Source: Ambulatory Visit | Attending: Obstetrics and Gynecology | Admitting: Obstetrics and Gynecology

## 2012-04-06 DIAGNOSIS — O479 False labor, unspecified: Secondary | ICD-10-CM

## 2012-04-06 DIAGNOSIS — R1084 Generalized abdominal pain: Secondary | ICD-10-CM

## 2012-04-06 DIAGNOSIS — R109 Unspecified abdominal pain: Secondary | ICD-10-CM | POA: Insufficient documentation

## 2012-04-06 DIAGNOSIS — O99891 Other specified diseases and conditions complicating pregnancy: Secondary | ICD-10-CM | POA: Insufficient documentation

## 2012-04-06 DIAGNOSIS — O26899 Other specified pregnancy related conditions, unspecified trimester: Secondary | ICD-10-CM

## 2012-04-06 DIAGNOSIS — R143 Flatulence: Secondary | ICD-10-CM

## 2012-04-06 DIAGNOSIS — R141 Gas pain: Secondary | ICD-10-CM

## 2012-04-06 LAB — URINALYSIS, ROUTINE W REFLEX MICROSCOPIC
Bilirubin Urine: NEGATIVE
Hgb urine dipstick: NEGATIVE
Protein, ur: NEGATIVE mg/dL
Urobilinogen, UA: 0.2 mg/dL (ref 0.0–1.0)

## 2012-04-06 LAB — WET PREP, GENITAL
Clue Cells Wet Prep HPF POC: NONE SEEN
Trich, Wet Prep: NONE SEEN

## 2012-04-06 MED ORDER — GI COCKTAIL ~~LOC~~
30.0000 mL | Freq: Once | ORAL | Status: AC
  Administered 2012-04-06: 30 mL via ORAL
  Filled 2012-04-06: qty 30

## 2012-04-06 NOTE — MAU Provider Note (Signed)
Chief Complaint:  Abdominal Pain    First Provider Initiated Contact with Patient 04/06/12 1210      Katherine Robbins is  20 y.o. G1P0000.  Patient's last menstrual period was 09/29/2011..  [redacted]w[redacted]d   She presents complaining of Abdominal Pain . Onset is described as intermittent and has been present for  1 days. States pain is sharp and shooting in upper, mid-abd. Reports she notices it once every 30 minutes.  Obstetrical/Gynecological History: OB History    Grav Para Term Preterm Abortions TAB SAB Ect Mult Living   1 0 0 0 0 0 0 0 0 0       Past Medical History: Past Medical History  Diagnosis Date  . Ovarian cyst     Past Surgical History: Past Surgical History  Procedure Date  . No past surgeries     Family History: Family History  Problem Relation Age of Onset  . Diabetes Paternal Grandmother   . Anesthesia problems Neg Hx     Social History: History  Substance Use Topics  . Smoking status: Never Smoker   . Smokeless tobacco: Never Used  . Alcohol Use: No    Allergies:  Allergies  Allergen Reactions  . Shellfish Allergy Itching and Swelling    Prescriptions prior to admission  Medication Sig Dispense Refill  . calcium carbonate (TUMS EX) 750 MG chewable tablet Chew 2 tablets by mouth as needed. Used for heartburn.      . diphenhydrAMINE (BENADRYL) 25 mg capsule Take 25 mg by mouth as needed. Used for allergies.      . Elastic Bandages & Supports (COMFORT FIT MATERNITY SUPP MED) MISC 1 Device by Does not apply route daily.  1 each  0  . PRENATAL VITAMINS PO Take by mouth.      . promethazine (PHENERGAN) 25 MG tablet Take 1 tablet (25 mg total) by mouth every 6 (six) hours as needed for nausea. May take 1/2 tablet for mild symptoms.  Sedation precautions  30 tablet  0    Review of Systems - History obtained from chart review Respiratory ROS: no cough, shortness of breath, or wheezing Cardiovascular ROS: no chest pain or dyspnea on  exertion Gastrointestinal ROS: positive for - constipation and gas/bloating Genito-Urinary ROS: no dysuria, trouble voiding, or hematuria, no vaginal bleeding, + white/creamy discharge  Physical Exam   Blood pressure 92/56, pulse 104, temperature 97.9 F (36.6 C), temperature source Oral, resp. rate 18, height 5' (1.524 m), weight 160 lb (72.576 kg), last menstrual period 09/29/2011.  General: General appearance - alert, well appearing, and in no distress, oriented to person, place, and time and overweight Mental status - alert, oriented to person, place, and time, normal mood, behavior, speech, dress, motor activity, and thought processes, affect appropriate to mood Chest - clear to auscultation, no wheezes, rales or rhonchi, symmetric air entry Heart - normal rate, regular rhythm, normal S1, S2, no murmurs, rubs, clicks or gallops Abdomen - gravid, non tender Focused Gynecological Exam: cervix: closed/thick/firm FHT: Cat I tracing Toco: no contracions  Labs: Recent Results (from the past 24 hour(s))  URINALYSIS, ROUTINE W REFLEX MICROSCOPIC   Collection Time   04/06/12 11:45 AM      Component Value Range   Color, Urine YELLOW  YELLOW    APPearance HAZY (*) CLEAR    Specific Gravity, Urine 1.010  1.005 - 1.030    pH 7.5  5.0 - 8.0    Glucose, UA NEGATIVE  NEGATIVE (mg/dL)   Hgb  urine dipstick NEGATIVE  NEGATIVE    Bilirubin Urine NEGATIVE  NEGATIVE    Ketones, ur NEGATIVE  NEGATIVE (mg/dL)   Protein, ur NEGATIVE  NEGATIVE (mg/dL)   Urobilinogen, UA 0.2  0.0 - 1.0 (mg/dL)   Nitrite NEGATIVE  NEGATIVE    Leukocytes, UA TRACE (*) NEGATIVE   URINE MICROSCOPIC-ADD ON   Collection Time   04/06/12 11:45 AM      Component Value Range   Squamous Epithelial / LPF MANY (*) RARE    WBC, UA 3-6  <3 (WBC/hpf)   Bacteria, UA MANY (*) RARE   WET PREP, GENITAL   Collection Time   04/06/12 12:18 PM      Component Value Range   Yeast Wet Prep HPF POC NONE SEEN  NONE SEEN    Trich, Wet Prep  NONE SEEN  NONE SEEN    Clue Cells Wet Prep HPF POC NONE SEEN  NONE SEEN    WBC, Wet Prep HPF POC FEW (*) NONE SEEN    MD Consult: Discussed with Dr. Ellyn Hack, agrees with plan. 12:50 PM   Assessment: 1. Gas pain   2. Pain of round ligament complicating pregnancy, antepartum      Plan: Discharge home FU as scheduled in office  Valene Villa E. 04/06/2012,12:46 PM

## 2012-04-06 NOTE — MAU Note (Signed)
Pt in c/o sharp mid abdominal pains that radiate from bottom to top of abdomen.  Reports a vaginal discharge that is white and odorous.  Denies any bleeding.  + FM.

## 2012-04-06 NOTE — Discharge Instructions (Signed)
Bloating Bloating is the feeling of fullness in your belly. You may feel as though your pants are too tight. Often the cause of bloating is overeating, retaining fluids, or having gas in your bowel. It is also caused by swallowing air and eating foods that cause gas. Irritable bowel syndrome is one of the most common causes of bloating. Constipation is also a common cause. Sometimes more serious problems can cause bloating. SYMPTOMS  Usually there is a feeling of fullness, as though your abdomen is bulged out. There may be mild discomfort.  DIAGNOSIS  Usually no particular testing is necessary for most bloating. If the condition persists and seems to become worse, your caregiver may do additional testing.  TREATMENT   There is no direct treatment for bloating.   Do not put gas into the bowel. Avoid chewing gum and sucking on candy. These tend to make you swallow air. Swallowing air can also be a nervous habit. Try to avoid this.   Avoiding high residue diets will help. Eat foods with soluble fibers (examples include root vegetables, apples, or barley) and substitute dairy products with soy and rice products. This helps irritable bowel syndrome.   If constipation is the cause, then a high residue diet with more fiber will help.   Avoid carbonated beverages.   Over-the-counter preparations are available that help reduce gas. Your pharmacist can help you with this.  SEEK MEDICAL CARE IF:   Bloating continues and seems to be getting worse.   You notice a weight gain.   You have a weight loss but the bloating is getting worse.   You have changes in your bowel habits or develop nausea or vomiting.  SEEK IMMEDIATE MEDICAL CARE IF:   You develop shortness of breath or swelling in your legs.   You have an increase in abdominal pain or develop chest pain.  Document Released: 09/14/2006 Document Revised: 11/03/2011 Document Reviewed: 11/02/2007 [facility] Patient Information 2012 [place],  [place].Preterm Labor Preterm labor is when labor starts at less than 37 weeks of pregnancy. The normal length of a pregnancy is 39 to 41 weeks. CAUSES Often, there is no identifiable underlying cause as to why a woman goes into preterm labor. However, one of the most common known causes of preterm labor is infection. Infections of the uterus, cervix, vagina, amniotic sac, bladder, kidney, or even the lungs (pneumonia) can cause labor to start. Other causes of preterm labor include:  Urogenital infections, such as yeast infections and bacterial vaginosis.   Uterine abnormalities (uterine shape, uterine septum, fibroids, bleeding from the placenta).   A cervix that has been operated on and opens prematurely.   Malformations in the baby.   Multiple gestations (twins, triplets, and so on).   Breakage of the amniotic sac.  Additional risk factors for preterm labor include:  Previous history of preterm labor.   Premature rupture of membranes (PROM).   A placenta that covers the opening of the cervix (placenta previa).   A placenta that separates from the uterus (placenta abruption).   A cervix that is too weak to hold the baby in the uterus (incompetence cervix).   Having too much fluid in the amniotic sac (polyhydramnios).   Taking illegal drugs or smoking while pregnant.   Not gaining enough weight while pregnant.   Women younger than 76 and older than 21 years old.   Low socioeconomic status.   African-American ethnicity.  SYMPTOMS Signs and symptoms of preterm labor include:  Menstrual-like cramps.  Contractions that are 30 to 70 seconds apart, become very regular, closer together, and are more intense and painful.   Contractions that start on the top of the uterus and spread down to the lower abdomen and back.   A sense of increased pelvic pressure or back pain.   A watery or bloody discharge that comes from the vagina.  DIAGNOSIS  A diagnosis can be confirmed  by:  A vaginal exam.   An ultrasound of the cervix.   Sampling (swabbing) cervico-vaginal secretions. These samples can be tested for the presence of fetal fibronectin. This is a protein found in cervical discharge which is associated with preterm labor.   Fetal monitoring.  TREATMENT  Depending on the length of the pregnancy and other circumstances, a caregiver may suggest bed rest. If necessary, there are medicines that can be given to stop contractions and to quicken fetal lung maturity. If labor happens before 34 weeks of pregnancy, a prolonged hospital stay may be recommended. Treatment depends on the condition of both the mother and baby. PREVENTION There are some things a mother can do to lower the risk of preterm labor in future pregnancies. A woman can:   Stop smoking.   Maintain healthy weight gain and avoid chemicals and drugs that are not necessary.   Be watchful for any type of infection.   Inform her caregiver if she has a known history of preterm labor.  Document Released: 02/04/2004 Document Revised: 11/03/2011 Document Reviewed: 03/11/2011 [facility] Patient Information 2012 [place], [place].Round Ligament Pain The round ligament is made up of muscle and fibrous tissue. It is attached to the uterus near the fallopian tube. The round ligament is located on both sides of the uterus and helps support the position of the uterus. It usually begins in the second trimester of pregnancy when the uterus comes out of the pelvis. The pain can come and go until the baby is delivered. Round ligament pain is not a serious problem and does not cause harm to the baby. CAUSE During pregnancy the uterus grows the most from the second trimester to delivery. As it grows, it stretches and slightly twists the round ligaments. When the uterus leans from one side to the other, the round ligament on the opposite side pulls and stretches. This can cause pain. SYMPTOMS  Pain can occur on one side or  both sides. The pain is usually a short, sharp, and pinching-like. Sometimes it can be a dull, lingering and aching pain. The pain is located in the lower side of the abdomen or in the groin. The pain is internal and usually starts deep in the groin and moves up to the outside of the hip area. Pain can occur with:  Sudden change in position like getting out of bed or a chair.   Rolling over in bed.   Coughing or sneezing.   Walking too much.   Any type of physical activity.  DIAGNOSIS  Your caregiver will make sure there are no serious problems causing the pain. When nothing serious is found, the symptoms usually indicate that the pain is from the round ligament. TREATMENT   Sit down and relax when the pain starts.   Flex your knees up to your belly.   Lay on your side with a pillow under your belly (abdomen) and another one between your legs.   Sit in a hot bath for 15 to 20 minutes or until the pain goes away.  HOME CARE INSTRUCTIONS   Only  take over-the-counter or prescriptions medicines for pain, discomfort or fever as directed by your caregiver.   Sit and stand slowly.   Avoid long walks if it causes pain.   Stop or lessen your physical activities if it causes pain.  SEEK MEDICAL CARE IF:   The pain does not go away with any of your treatment.   You need stronger medication for the pain.   You develop back pain that you did not have before with the side pain.  SEEK IMMEDIATE MEDICAL CARE IF:   You develop a temperature of 102 F (38.9 C) or higher.   You develop uterine contractions.   You develop vaginal bleeding.   You develop nausea, vomiting or diarrhea.   You develop chills.   You have pain when you urinate.  Document Released: 08/23/2008 Document Revised: 11/03/2011 Document Reviewed: 08/23/2008 [facility] Patient Information 2012 [place], [place].

## 2012-04-13 LAB — OB RESULTS CONSOLE RPR: RPR: NONREACTIVE

## 2012-05-26 ENCOUNTER — Encounter (HOSPITAL_COMMUNITY): Payer: Self-pay | Admitting: *Deleted

## 2012-05-26 ENCOUNTER — Inpatient Hospital Stay (HOSPITAL_COMMUNITY)
Admission: AD | Admit: 2012-05-26 | Discharge: 2012-05-26 | Disposition: A | Discharge status: Home / Self Care | Payer: Medicaid Other | Source: Ambulatory Visit | Attending: Obstetrics and Gynecology | Admitting: Obstetrics and Gynecology

## 2012-05-26 DIAGNOSIS — O212 Late vomiting of pregnancy: Secondary | ICD-10-CM | POA: Insufficient documentation

## 2012-05-26 DIAGNOSIS — M25473 Effusion, unspecified ankle: Secondary | ICD-10-CM

## 2012-05-26 DIAGNOSIS — R209 Unspecified disturbances of skin sensation: Secondary | ICD-10-CM | POA: Insufficient documentation

## 2012-05-26 DIAGNOSIS — IMO0002 Reserved for concepts with insufficient information to code with codable children: Secondary | ICD-10-CM | POA: Insufficient documentation

## 2012-05-26 DIAGNOSIS — O219 Vomiting of pregnancy, unspecified: Secondary | ICD-10-CM

## 2012-05-26 LAB — URINALYSIS, ROUTINE W REFLEX MICROSCOPIC
Nitrite: NEGATIVE
Protein, ur: 30 mg/dL — AB
Urobilinogen, UA: 0.2 mg/dL (ref 0.0–1.0)

## 2012-05-26 LAB — URINE MICROSCOPIC-ADD ON

## 2012-05-26 MED ORDER — ONDANSETRON 8 MG PO TBDP
8.0000 mg | ORAL_TABLET | Freq: Once | ORAL | Status: AC
  Administered 2012-05-26: 8 mg via ORAL
  Filled 2012-05-26: qty 1

## 2012-05-26 NOTE — ED Notes (Signed)
Swelling in left foot is much decrease almost normal. Pt reports numbness is gone.

## 2012-05-26 NOTE — Discharge Instructions (Signed)
Drink at least 8 8-oz glasses of water every day. Take Tylenol 325 mg 2 tablets by mouth every 4 hours if needed for pain. Keep your appointments in the office. Call your doctor if your symptoms worsen.

## 2012-05-26 NOTE — MAU Note (Addendum)
Pt reports  having numbness in her left foot that started 30 min and reports abd cramping and  having vomiting since earlier this morning.

## 2012-05-26 NOTE — MAU Provider Note (Signed)
History     CSN: [id number]  Arrival date and time: 05/26/12 1307   First Provider Initiated Contact with Patient 05/26/12 1331      Chief Complaint  Patient presents with  . Numbness   HPI Katherine Robbins 21 y.o. [redacted]w[redacted]d  Comes to MAU with nausea and vomiting today.  Also, having numbness in left leg and pain in left toes for 30 minutes prior to coming to MAU.  Mother accompanies.  Had "stress" at home today.  Tearful and had been sitting on the steps outside before numbness began.  OB History    Grav Para Term Preterm Abortions TAB SAB Ect Mult Living   1 0 0 0 0 0 0 0 0 0       Past Medical History  Diagnosis Date  . Ovarian cyst     Past Surgical History  Procedure Date  . No past surgeries     Family History  Problem Relation Age of Onset  . Diabetes Paternal Grandmother   . Anesthesia problems Neg Hx     History  Substance Use Topics  . Smoking status: Never Smoker   . Smokeless tobacco: Never Used  . Alcohol Use: No    Allergies:  Allergies  Allergen Reactions  . Shellfish Allergy Itching and Swelling    Prescriptions prior to admission  Medication Sig Dispense Refill  . calcium carbonate (TUMS EX) 750 MG chewable tablet Chew 2 tablets by mouth as needed. Used for heartburn.      . Prenatal Vit-Fe Fumarate-FA (PRENATAL MULTIVITAMIN) TABS Take 1 tablet by mouth every morning.      Marland Kitchen PRENATAL VITAMINS PO Take by mouth.      . promethazine (PHENERGAN) 25 MG tablet Take 1 tablet (25 mg total) by mouth every 6 (six) hours as needed for nausea. May take 1/2 tablet for mild symptoms.  Sedation precautions  30 tablet  0    Review of Systems  Constitutional: Negative for fever.  Gastrointestinal: Positive for nausea, vomiting and abdominal pain. Negative for diarrhea and constipation.  Genitourinary: Negative for dysuria.       No vaginal discharge. No vaginal bleeding. No dysuria.  Neurological:       Numbness in left foot Pain in left toes    Physical Exam   Blood pressure 104/57, pulse 124, temperature 98.5 F (36.9 C), resp. rate 18, height 5' (1.524 m), weight 166 lb (75.297 kg), last menstrual period 09/29/2011.  Physical Exam  Nursing note and vitals reviewed. Constitutional: She is oriented to person, place, and time. She appears well-developed and well-nourished.  HENT:  Head: Normocephalic.  Eyes: EOM are normal.  Neck: Neck supple.  GI: Soft. There is tenderness. There is no rebound and no guarding.  Musculoskeletal: Normal range of motion.  Neurological: She is alert and oriented to person, place, and time.  Skin: Skin is warm and dry.  Psychiatric: She has a normal mood and affect.    MAU Course  Procedures Results for orders placed during the hospital encounter of 05/26/12 (from the past 24 hour(s))  URINALYSIS, ROUTINE W REFLEX MICROSCOPIC     Status: Abnormal   Collection Time   05/26/12  1:40 PM      Component Value Range   Color, Urine YELLOW  YELLOW   APPearance CLOUDY (*) CLEAR   Specific Gravity, Urine 1.020  1.005 - 1.030   pH 7.0  5.0 - 8.0   Glucose, UA NEGATIVE  NEGATIVE mg/dL   Hgb urine  dipstick NEGATIVE  NEGATIVE   Bilirubin Urine NEGATIVE  NEGATIVE   Ketones, ur NEGATIVE  NEGATIVE mg/dL   Protein, ur 30 (*) NEGATIVE mg/dL   Urobilinogen, UA 0.2  0.0 - 1.0 mg/dL   Nitrite NEGATIVE  NEGATIVE   Leukocytes, UA MODERATE (*) NEGATIVE  URINE MICROSCOPIC-ADD ON     Status: Abnormal   Collection Time   05/26/12  1:40 PM      Component Value Range   Squamous Epithelial / LPF MANY (*) RARE   WBC, UA 11-20  <3 WBC/hpf   RBC / HPF 0-2  <3 RBC/hpf   Bacteria, UA MANY (*) RARE   Crystals CA OXALATE CRYSTALS (*) NEGATIVE   Client feeling better after resting in bed and with mother out of the room.  Able to walk to give urine specimen.  Walked on side of her left foot, but by the time she was discharged, had shoes on and walked without assistatance.  Was given Zofran in MAU.  Eating burger and  fries before discharge.  Left with mother.  MDM Consult with Dr. Jackelyn Knife re: plan of care Urine culture sent  Assessment and Plan  Nausea and vomiting Ankle edema  Plan Drink at least 8 8-oz glasses of water every day.  Take Tylenol 325 mg 2 tablets by mouth every 4 hours if needed for pain.  Keep your appointments in the office.  Call your doctor if your symptoms worsen.  Katherine Robbins 05/26/2012, 1:43 PM

## 2012-05-27 LAB — URINE CULTURE: Colony Count: 35000

## 2012-05-30 NOTE — Progress Notes (Signed)
FHT from 6-29 reviewed.  Reactive NST, occasional variable decel, no regular ctx.

## 2012-06-07 LAB — OB RESULTS CONSOLE GBS: GBS: NEGATIVE

## 2012-06-25 ENCOUNTER — Observation Stay (HOSPITAL_COMMUNITY): Payer: Medicaid Other

## 2012-06-25 ENCOUNTER — Encounter (HOSPITAL_COMMUNITY): Payer: Self-pay | Admitting: *Deleted

## 2012-06-25 ENCOUNTER — Observation Stay (HOSPITAL_COMMUNITY)
Admission: AD | Admit: 2012-06-25 | Discharge: 2012-06-26 | DRG: 780 | Disposition: A | Discharge status: Home / Self Care | Payer: Medicaid Other | Source: Ambulatory Visit | Attending: Obstetrics and Gynecology | Admitting: Obstetrics and Gynecology

## 2012-06-25 DIAGNOSIS — W010XXA Fall on same level from slipping, tripping and stumbling without subsequent striking against object, initial encounter: Secondary | ICD-10-CM | POA: Diagnosis present

## 2012-06-25 DIAGNOSIS — S3991XA Unspecified injury of abdomen, initial encounter: Secondary | ICD-10-CM

## 2012-06-25 DIAGNOSIS — Z3490 Encounter for supervision of normal pregnancy, unspecified, unspecified trimester: Secondary | ICD-10-CM

## 2012-06-25 DIAGNOSIS — O479 False labor, unspecified: Principal | ICD-10-CM | POA: Diagnosis present

## 2012-06-25 HISTORY — DX: Encounter for supervision of normal pregnancy, unspecified, unspecified trimester: Z34.90

## 2012-06-25 HISTORY — DX: Unspecified injury of abdomen, initial encounter: S39.91XA

## 2012-06-25 HISTORY — DX: Chlamydial infection, unspecified: A74.9

## 2012-06-25 LAB — KLEIHAUER-BETKE STAIN: Fetal Cells %: 0.1 %

## 2012-06-25 MED ORDER — SODIUM CHLORIDE 0.9 % IJ SOLN
INTRAMUSCULAR | Status: AC
  Filled 2012-06-25: qty 3

## 2012-06-25 MED ORDER — ZOLPIDEM TARTRATE 5 MG PO TABS
5.0000 mg | ORAL_TABLET | Freq: Every evening | ORAL | Status: DC | PRN
  Administered 2012-06-25: 5 mg via ORAL
  Filled 2012-06-25: qty 1

## 2012-06-25 MED ORDER — LACTATED RINGERS IV SOLN
INTRAVENOUS | Status: DC
  Administered 2012-06-26: via INTRAVENOUS

## 2012-06-25 MED ORDER — ACETAMINOPHEN 325 MG PO TABS
650.0000 mg | ORAL_TABLET | ORAL | Status: DC | PRN
  Administered 2012-06-26: 650 mg via ORAL
  Filled 2012-06-25: qty 2

## 2012-06-25 MED ORDER — DOCUSATE SODIUM 100 MG PO CAPS
100.0000 mg | ORAL_CAPSULE | Freq: Every day | ORAL | Status: DC
  Administered 2012-06-26: 100 mg via ORAL
  Filled 2012-06-25: qty 1

## 2012-06-25 MED ORDER — PRENATAL MULTIVITAMIN CH
1.0000 | ORAL_TABLET | Freq: Every day | ORAL | Status: DC
  Administered 2012-06-26: 1 via ORAL
  Filled 2012-06-25: qty 1

## 2012-06-25 MED ORDER — RHO D IMMUNE GLOBULIN 1500 UNIT/2ML IJ SOLN
300.0000 ug | Freq: Once | INTRAMUSCULAR | Status: AC
  Administered 2012-06-26: 300 ug via INTRAVENOUS
  Filled 2012-06-25: qty 2

## 2012-06-25 MED ORDER — CALCIUM CARBONATE ANTACID 500 MG PO CHEW: 2.0000 | CHEWABLE_TABLET | ORAL | Status: DC | PRN

## 2012-06-25 NOTE — MAU Note (Signed)
Pt states she tripped and hit her abdomen on the corner of a desk.  She has a scratch on her belly, but denies pain, bleeding or leaking.

## 2012-06-25 NOTE — MAU Provider Note (Signed)
See H&P  Dorathy Kinsman 7/29/20136:18 PM

## 2012-06-25 NOTE — MAU Note (Signed)
Was feeling what felt like contractions initially, no longer feeling that or having pain.

## 2012-06-25 NOTE — MAU Note (Signed)
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  Baby has not been moving really since.

## 2012-06-25 NOTE — H&P (Addendum)
Katherine Robbins is a 21 y.o. female G1P0 at 38+ with abdominal trauma - fell down, hit abdomen on counter.  +FM, no LOF, no VB, occ ctx; Uncomplicated PNC. Maternal Medical History:  Contractions: Frequency: regular.   Perceived severity is moderate.    Fetal activity: Perceived fetal activity is normal.      OB History    Grav Para Term Preterm Abortions TAB SAB Ect Mult Living   1 0 0 0 0 0 0 0 0 0     G1 present, no abn pap, no STDs Past Medical History  Diagnosis Date  . Ovarian cyst    Past Surgical History  Procedure Date  . No past surgeries    Family History: family history includes Diabetes in her paternal grandmother.  There is no history of Anesthesia problems.Arthritis, Asthma, Bipolar, Breast Ca, HTN, Kid DZ Social History:  reports that she has never smoked. She has never used smokeless tobacco. She reports that she does not drink alcohol or use illicit drugs. Meds PNV All NKDA, Shellfish  Prenatal Transfer Tool  Maternal Diabetes: No Genetic Screening: Normal Maternal Ultrasounds/Referrals: Normal Fetal Ultrasounds or other Referrals:  None Maternal Substance Abuse:  No Significant Maternal Medications:  None Significant Maternal Lab Results:  None Other Comments:  Rhogam 5/17  Review of Systems  Constitutional: Negative.   HENT: Negative.   Eyes: Negative.   Respiratory: Negative.   Cardiovascular: Negative.   Gastrointestinal: Negative.   Genitourinary: Negative.   Musculoskeletal: Negative.   Skin: Negative.   Neurological: Negative.   Psychiatric/Behavioral: Negative.     Dilation: 1 Effacement (%): 80 Station: -2 Exam by:: Katherine Robbins CNM Blood pressure 124/92, pulse 98, temperature 98.5 F (36.9 C), temperature source Oral, resp. rate 18, last menstrual period 09/29/2011. Maternal Exam:  Abdomen: Fundal height is appropriate for gestation.       Physical Exam  Constitutional: She is oriented to person, place, and time. She appears  well-developed and well-nourished.  HENT:  Head: Normocephalic and atraumatic.  Eyes: Pupils are equal, round, and reactive to light.  Neck: Normal range of motion. Neck supple. No thyromegaly present.  Cardiovascular: Normal rate and regular rhythm.   Respiratory: Effort normal and breath sounds normal. No respiratory distress.  GI: Soft. Bowel sounds are normal. There is no tenderness.  Musculoskeletal: Normal range of motion.  Neurological: She is alert and oriented to person, place, and time.  Skin: Skin is warm and dry.  Psychiatric: She has a normal mood and affect. Her behavior is normal.    Prenatal labs: ABO, Rh:  O neg Antibody:  neg Rubella:  immune RPR:   NR HBsAg:   neg HIV:   neg GBS:   unk Hgb 12.1/Plt 288K/ Hgb electro WNL/ GC neg/ Chl neg/ First Tri Scre and AFP WNL/  [place] early cwd EDC 8/8 Nl anat, post plac, female  Assessment/Plan: 21yo G1P0 at 38+ with abd trauma Cont EFM/ toco x 24 hrs, incident at 2pm KB + - give Rhogam [place] no signs of abruption  Katherine Robbins 06/25/2012, 6:44 PM

## 2012-06-25 NOTE — Plan of Care (Signed)
Problem: Consults Goal: Birthing Suites Patient Information Press F2 to bring up selections list  Outcome: Completed/Met Date Met:  06/25/12  Pt 37-[redacted] weeks EGA     

## 2012-06-26 MED ORDER — LACTATED RINGERS IV BOLUS (SEPSIS): 500.0000 mL | Freq: Once | INTRAVENOUS | Status: DC

## 2012-06-26 MED ORDER — BUTORPHANOL TARTRATE 1 MG/ML IJ SOLN
1.0000 mg | Freq: Once | INTRAMUSCULAR | Status: AC
  Administered 2012-06-26: 1 mg via INTRAVENOUS
  Filled 2012-06-26: qty 1

## 2012-06-26 NOTE — Progress Notes (Signed)
Patient ID: Katherine Robbins, female   DOB: 02-26-1991, 21 y.o.   MRN: [id number] 21yo G1P0 at 38+ with abd trauma after fall and ctx, + KB, s/p Rhogam +FM, no LOF, no VB, occ ctx, no c/o's AFVSS gen NAD FHT130-140, category 1 toco occ SVE 1/80/-2 by RN  Will d/c at 2pm Bleeding and pain precautions

## 2012-06-26 NOTE — Discharge Summary (Signed)
Obstetric Discharge Summary Reason for Admission: abdominal trauma, ctx, + KB Prenatal Procedures: NST and Rhogam Intrapartum Procedures: N/A Postpartum Procedures: N/A Complications-Operative and Postpartum: none Hemoglobin  Date Value Range Status  03/21/2012 12.0  12.0 - 15.0 g/dL Final     HCT  Date Value Range Status  03/21/2012 35.5* 36.0 - 46.0 % Final    Physical Exam:  General: alert and no distress Lochia: appropriate Uterine Fundus: soft, FNT   Discharge Diagnoses: observation after abdominal trauma  Discharge Information: Date: 06/26/2012 Activity: unrestricted Diet: routine Medications: PNV Condition: stable Instructions: refer to practice specific booklet Discharge to: home Follow-up Information    Follow up with BOVARD,Reni Hausner, MD in 3 days. (as scheduled)    Contact information:   [address] [facility] [address] [place] [address] [phone]          Newborn Data: N/A  BOVARD,Amani Nodarse 06/26/2012, 9:12 AM

## 2012-06-26 NOTE — Progress Notes (Signed)
Patient ID: Katherine Robbins, female   DOB: 1991-01-15, 21 y.o.   MRN: [id number] Pt was uncomfortable earlier and was not sure about going home.  Received stadol x 1 dose (1mg ) and now is pain free.  FHR has remained reassuring.   Ctx very irregular and mild.  Cervix unchanged 70/1/-2.  Will d/c home with vicodin #15 to use if becomes uncomfortable again from bruising with fall.  Has appt in office 06/29/12

## 2012-06-26 NOTE — Plan of Care (Signed)
Pt d/c'ed home undelivered; to car ambulatory; instructions as per d/c teaching sheet.  Pt declined w/c, walked with FOB and mother.

## 2012-06-27 LAB — RH IG WORKUP (INCLUDES ABO/RH): Unit division: 0

## 2012-06-29 NOTE — H&P (Signed)
Katherine Robbins is a 21 y.o. female G1P0 at 39+ for iol given term status ad favorable cervix.  Prenatal care complicated by fall and admission for monitoring, +KB, had rhogam.  +FM, no LOF, no VB, occ ctxMaternal Medical History:  Contractions: Frequency: irregular.    Fetal activity: Perceived fetal activity is normal.      OB History    Grav Para Term Preterm Abortions TAB SAB Ect Mult Living   1 0 0 0 0 0 0 0 0 0     G1 present, no Pap, h/o Chl Past Medical History  Diagnosis Date  . Ovarian cyst   . Chlamydia   . Blunt abdominal trauma 06/25/2012  . Normal pregnancy 06/25/2012  seasonal allergy Past Surgical History  Procedure Date  . No past surgeries    Family History: family history includes Diabetes in her paternal grandmother.  There is no history of Anesthesia problems.Arthritis, Asthama,< bipolar, Breast Cancer, DM, HTN, ESRD. Social History:  reports that she has never smoked. She has never used smokeless tobacco. She reports that she does not drink alcohol or use illicit drugs.singke Meds PNV All NKDA, Shellfish  Prenatal Transfer Tool  Maternal Diabetes: Nono Genetic Screening: NormalWNL Maternal Ultrasounds/Referrals: Normalnl   Fetal Ultrasounds or other Referrals:  Noneno Maternal Substance Abuse:  Nono Significant Maternal Medications:  Noneno Significant Maternal Lab Results:  Lab values include: Group B Strep negativegbbs neg Other Comments:  admitted for abd trauma, got rhogamadmitted for ab trauma, got rhogam  Review of Systems  Constitutional: Negative.   HENT: Negative.   Eyes: Negative.   Respiratory: Negative.   Cardiovascular: Negative.   Gastrointestinal: Negative.   Genitourinary: Negative.   Musculoskeletal: Negative.   Skin: Negative.   Neurological: Negative.   Psychiatric/Behavioral: Negative.       Last menstrual period 09/29/2011. Maternal Exam:  Abdomen: Fundal height is appropraite for gestation .   Estimated fetal weight is  7.5#.   Fetal presentation: vertex     Physical Exam  Constitutional: She is oriented to person, place, and time. She appears well-developed and well-nourished.  HENT:  Head: Normocephalic and atraumatic.  Eyes: Conjunctivae are normal. Pupils are equal, round, and reactive to light.  Neck: Normal range of motion. Neck supple. No thyromegaly present.  Cardiovascular: Normal rate.   Respiratory: Effort normal and breath sounds normal. No respiratory distress. She has no wheezes.  GI: Soft. Bowel sounds are normal. There is no tenderness.  Genitourinary: Vagina normal and uterus normal.  Musculoskeletal: Normal range of motion.  Neurological: She is alert and oriented to person, place, and time.  Skin: Skin is warm and dry.  Psychiatric: She has a normal mood and affect. Her behavior is normal.    Prenatal labs: ABO, Rh: --/--/O NEG (07/29 2150) Antibody: NEG (07/29 2150) Rubella:  immune RPR: Nonreactive (05/17 0000)  HBsAg: Negative (01/31 0000)  HIV: Non-reactive (01/31 0000)  GBS:   neg Hgb 12.1/Plt 288K/ Hgb electro WNL/GC neg, Chl neg, First Tri Scre WNL/ AFP WNL/glucola 97/  Tdap/Rhogam 04/12/12  [place] cwd, nl anat, post plac, female Assessment/Plan: 21 yo G1P0 at 39+ for IOL giventerm staus and favorable cervix Epidural for pain AROM and pit to sugment gbbs neg   Katherine Robbins,Katherine Robbins 06/29/2012, 11:55 PM

## 2012-06-30 ENCOUNTER — Encounter (HOSPITAL_COMMUNITY): Payer: Self-pay

## 2012-06-30 ENCOUNTER — Inpatient Hospital Stay (HOSPITAL_COMMUNITY)
Admission: RE | Admit: 2012-06-30 | Discharge: 2012-07-04 | DRG: 766 | Disposition: A | Discharge status: Home / Self Care | Payer: Medicaid Other | Source: Ambulatory Visit | Attending: Obstetrics and Gynecology | Admitting: Obstetrics and Gynecology

## 2012-06-30 VITALS — BP 155/92 | HR 74 | Temp 98.2°F | Resp 20 | Ht 60.0 in | Wt 175.0 lb

## 2012-06-30 DIAGNOSIS — Z98891 History of uterine scar from previous surgery: Secondary | ICD-10-CM

## 2012-06-30 DIAGNOSIS — S3991XA Unspecified injury of abdomen, initial encounter: Secondary | ICD-10-CM

## 2012-06-30 DIAGNOSIS — Z3490 Encounter for supervision of normal pregnancy, unspecified, unspecified trimester: Secondary | ICD-10-CM

## 2012-06-30 HISTORY — DX: History of uterine scar from previous surgery: Z98.891

## 2012-06-30 LAB — RPR: RPR Ser Ql: NONREACTIVE

## 2012-06-30 LAB — COMPREHENSIVE METABOLIC PANEL
ALT: 9 U/L (ref 0–35)
AST: 23 U/L (ref 0–37)
Albumin: 3.1 g/dL — ABNORMAL LOW (ref 3.5–5.2)
BUN: 9 mg/dL (ref 6–23)
Calcium: 9.3 mg/dL (ref 8.4–10.5)
Calcium: 9 mg/dL (ref 8.4–10.5)
Creatinine, Ser: 0.82 mg/dL (ref 0.50–1.10)
Creatinine, Ser: 0.96 mg/dL (ref 0.50–1.10)
GFR calc Af Amer: >90 mL/min (ref >90)
GFR calc Af Amer: >90 mL/min (ref >90)
Glucose, Bld: 91 mg/dL (ref 70–99)
Sodium: 136 mEq/L (ref 135–145)
Total Bilirubin: 0.2 mg/dL — ABNORMAL LOW (ref 0.3–1.2)
Total Protein: 6.8 g/dL (ref 6.0–8.3)
Total Protein: 6.2 g/dL (ref 6.0–8.3)

## 2012-06-30 LAB — CBC
Hemoglobin: 12.2 g/dL (ref 12.0–15.0)
MCH: 29.4 pg (ref 26.0–34.0)
MCHC: 33.5 g/dL (ref 30.0–36.0)
MCV: 87.8 fL (ref 78.0–100.0)
Platelets: 214 10*3/uL (ref 150–400)
Platelets: 190 10*3/uL (ref 150–400)
RBC: 4.11 MIL/uL (ref 3.87–5.11)
WBC: 10 10*3/uL (ref 4.0–10.5)

## 2012-06-30 MED ORDER — LACTATED RINGERS IV SOLN
INTRAVENOUS | Status: DC
  Administered 2012-06-30 – 2012-07-01 (×2): via INTRAVENOUS

## 2012-06-30 MED ORDER — LIDOCAINE HCL (PF) 1 % IJ SOLN
INTRAMUSCULAR | Status: DC | PRN
  Administered 2012-06-30 (×3): 4 mL

## 2012-06-30 MED ORDER — LIDOCAINE HCL (PF) 1 % IJ SOLN: 30.0000 mL | INTRAMUSCULAR | Status: DC | PRN

## 2012-06-30 MED ORDER — IBUPROFEN 600 MG PO TABS: 600.0000 mg | ORAL_TABLET | Freq: Four times a day (QID) | ORAL | Status: DC | PRN

## 2012-06-30 MED ORDER — TERBUTALINE SULFATE 1 MG/ML IJ SOLN: 0.2500 mg | Freq: Once | INTRAMUSCULAR | Status: AC | PRN

## 2012-06-30 MED ORDER — ONDANSETRON HCL 4 MG/2ML IJ SOLN
4.0000 mg | Freq: Four times a day (QID) | INTRAMUSCULAR | Status: DC | PRN
  Administered 2012-06-30: 4 mg via INTRAVENOUS
  Filled 2012-06-30: qty 2

## 2012-06-30 MED ORDER — LACTATED RINGERS IV SOLN
500.0000 mL | INTRAVENOUS | Status: DC | PRN
  Administered 2012-06-30: 500 mL via INTRAVENOUS

## 2012-06-30 MED ORDER — OXYCODONE-ACETAMINOPHEN 5-325 MG PO TABS: 1.0000 | ORAL_TABLET | ORAL | Status: DC | PRN

## 2012-06-30 MED ORDER — LACTATED RINGERS IV SOLN
500.0000 mL | Freq: Once | INTRAVENOUS | Status: AC
  Administered 2012-06-30: 500 mL via INTRAVENOUS

## 2012-06-30 MED ORDER — ACETAMINOPHEN 325 MG PO TABS
650.0000 mg | ORAL_TABLET | ORAL | Status: DC | PRN
  Administered 2012-07-01: 650 mg via ORAL
  Filled 2012-06-30: qty 2

## 2012-06-30 MED ORDER — OXYTOCIN BOLUS FROM INFUSION
250.0000 mL | Freq: Once | INTRAVENOUS | Status: DC
  Filled 2012-06-30: qty 500

## 2012-06-30 MED ORDER — OXYTOCIN 40 UNITS IN LACTATED RINGERS INFUSION - SIMPLE MED: 62.5000 mL/h | Freq: Once | INTRAVENOUS | Status: DC

## 2012-06-30 MED ORDER — PHENYLEPHRINE 40 MCG/ML (10ML) SYRINGE FOR IV PUSH (FOR BLOOD PRESSURE SUPPORT)
80.0000 ug | PREFILLED_SYRINGE | INTRAVENOUS | Status: DC | PRN
  Filled 2012-06-30: qty 5

## 2012-06-30 MED ORDER — BUTORPHANOL TARTRATE 1 MG/ML IJ SOLN: 2.0000 mg | INTRAMUSCULAR | Status: DC | PRN

## 2012-06-30 MED ORDER — EPHEDRINE 5 MG/ML INJ
10.0000 mg | INTRAVENOUS | Status: DC | PRN
  Filled 2012-06-30: qty 4

## 2012-06-30 MED ORDER — CITRIC ACID-SODIUM CITRATE 334-500 MG/5ML PO SOLN
30.0000 mL | ORAL | Status: DC | PRN
  Administered 2012-07-01: 30 mL via ORAL
  Filled 2012-06-30: qty 15

## 2012-06-30 MED ORDER — DIPHENHYDRAMINE HCL 50 MG/ML IJ SOLN: 12.5000 mg | INTRAMUSCULAR | Status: DC | PRN

## 2012-06-30 MED ORDER — TERBUTALINE SULFATE 1 MG/ML IJ SOLN
INTRAMUSCULAR | Status: DC
Start: 2012-06-30 — End: 2012-07-01
  Filled 2012-06-30: qty 1

## 2012-06-30 MED ORDER — EPHEDRINE 5 MG/ML INJ: 10.0000 mg | INTRAVENOUS | Status: DC | PRN

## 2012-06-30 MED ORDER — FLEET ENEMA 7-19 GM/118ML RE ENEM: 1.0000 | ENEMA | RECTAL | Status: DC | PRN

## 2012-06-30 MED ORDER — PHENYLEPHRINE 40 MCG/ML (10ML) SYRINGE FOR IV PUSH (FOR BLOOD PRESSURE SUPPORT): 80.0000 ug | PREFILLED_SYRINGE | INTRAVENOUS | Status: DC | PRN

## 2012-06-30 MED ORDER — FENTANYL 2.5 MCG/ML BUPIVACAINE 1/10 % EPIDURAL INFUSION (WH - ANES)
14.0000 mL/h | INTRAMUSCULAR | Status: DC
  Administered 2012-06-30 – 2012-07-01 (×6): 14 mL/h via EPIDURAL
  Filled 2012-06-30 (×6): qty 60

## 2012-06-30 MED ORDER — OXYTOCIN 40 UNITS IN LACTATED RINGERS INFUSION - SIMPLE MED
1.0000 m[IU]/min | INTRAVENOUS | Status: DC
  Administered 2012-06-30: 2 m[IU]/min via INTRAVENOUS
  Filled 2012-06-30: qty 1000

## 2012-06-30 NOTE — Progress Notes (Signed)
Discussed with MD pt increased pressures-orders to continue to monitor

## 2012-06-30 NOTE — Progress Notes (Signed)
Patient ID: Katherine Robbins, female   DOB: 21-Aug-1991, 21 y.o.   MRN: [id number]  Comfortable with epidural AF VSS, BP elevated 130/100 gen NAD FHTs 140's, category 1 toco q 2-4 IUPC placed without diff/comp  Will consider initiating pitocin Continue current mgmt

## 2012-06-30 NOTE — Anesthesia Procedure Notes (Signed)
Epidural Patient location during procedure: OB Start time: 06/30/2012 11:08 AM Reason for block: procedure for pain  Staffing Performed by: anesthesiologist   Preanesthetic Checklist Completed: patient identified, site marked, surgical consent, pre-op evaluation, timeout performed, IV checked, risks and benefits discussed and monitors and equipment checked  Epidural Patient position: sitting Prep: site prepped and draped and DuraPrep Patient monitoring: continuous pulse ox and blood pressure Approach: midline Injection technique: LOR air  Needle:  Needle type: Tuohy  Needle gauge: 17 G Needle length: 9 cm Catheter type: closed end flexible Catheter size: 19 Gauge Test dose: negative  Assessment Events: blood not aspirated, injection not painful, no injection resistance, negative IV test and no paresthesia  Additional Notes Discussed risk of headache, infection, bleeding, nerve injury and failed or incomplete block.  Patient voices understanding and wishes to proceed.

## 2012-06-30 NOTE — Progress Notes (Signed)
Patient ID: Katherine Robbins, female   DOB: September 09, 1991, 21 y.o.   MRN: [id number] Pt with some pressure.  Comfortable with epidural  AF VSS, BP elevated Nl PIH labs FHTs 160's category 2 toco 2 -  SVE 7/90/caput+2  D/w pt, labor process...will start pitocin.

## 2012-06-30 NOTE — Progress Notes (Signed)
Patient ID: Katherine Robbins, female   DOB: [birth year], 21 y.o.   MRN: [id number] Comfortable.  H&P reviewed AROM for clear fluid w/o diff/comp 2/80/-1 FHTs reassuring toco occassional

## 2012-06-30 NOTE — Anesthesia Preprocedure Evaluation (Signed)

## 2012-07-01 ENCOUNTER — Encounter (HOSPITAL_COMMUNITY): Payer: Self-pay | Admitting: Anesthesiology

## 2012-07-01 ENCOUNTER — Inpatient Hospital Stay (HOSPITAL_COMMUNITY): Payer: Medicaid Other | Admitting: Anesthesiology

## 2012-07-01 ENCOUNTER — Encounter (HOSPITAL_COMMUNITY): Payer: Self-pay

## 2012-07-01 ENCOUNTER — Encounter (HOSPITAL_COMMUNITY)
Admission: RE | Disposition: A | Discharge status: Home / Self Care | Payer: Self-pay | Source: Ambulatory Visit | Attending: Obstetrics and Gynecology

## 2012-07-01 DIAGNOSIS — Z98891 History of uterine scar from previous surgery: Secondary | ICD-10-CM

## 2012-07-01 HISTORY — DX: History of uterine scar from previous surgery: Z98.891

## 2012-07-01 SURGERY — Surgical Case
Anesthesia: Epidural | Site: Abdomen | Wound class: Clean Contaminated

## 2012-07-01 MED ORDER — SENNOSIDES-DOCUSATE SODIUM 8.6-50 MG PO TABS
2.0000 | ORAL_TABLET | Freq: Every day | ORAL | Status: DC
  Administered 2012-07-02 – 2012-07-03 (×2): 2 via ORAL

## 2012-07-01 MED ORDER — MEPERIDINE HCL 25 MG/ML IJ SOLN
INTRAMUSCULAR | Status: DC | PRN
  Administered 2012-07-01: 25 mg via INTRAVENOUS

## 2012-07-01 MED ORDER — NALOXONE HCL 0.4 MG/ML IJ SOLN: 0.4000 mg | INTRAMUSCULAR | Status: DC | PRN

## 2012-07-01 MED ORDER — SCOPOLAMINE 1 MG/3DAYS TD PT72
1.0000 | MEDICATED_PATCH | Freq: Once | TRANSDERMAL | Status: DC
  Administered 2012-07-01: 1.5 mg via TRANSDERMAL

## 2012-07-01 MED ORDER — DIPHENHYDRAMINE HCL 50 MG/ML IJ SOLN: 12.5000 mg | INTRAMUSCULAR | Status: DC | PRN

## 2012-07-01 MED ORDER — ONDANSETRON HCL 4 MG/2ML IJ SOLN: 4.0000 mg | Freq: Three times a day (TID) | INTRAMUSCULAR | Status: DC | PRN

## 2012-07-01 MED ORDER — LACTATED RINGERS IV SOLN
INTRAVENOUS | Status: DC
  Administered 2012-07-01: 22:00:00 via INTRAVENOUS

## 2012-07-01 MED ORDER — DIPHENHYDRAMINE HCL 25 MG PO CAPS
25.0000 mg | ORAL_CAPSULE | Freq: Four times a day (QID) | ORAL | Status: DC | PRN
  Administered 2012-07-02: 25 mg via ORAL
  Filled 2012-07-01: qty 1

## 2012-07-01 MED ORDER — NALBUPHINE HCL 10 MG/ML IJ SOLN
5.0000 mg | INTRAMUSCULAR | Status: DC | PRN
  Filled 2012-07-01: qty 1

## 2012-07-01 MED ORDER — SODIUM BICARBONATE 8.4 % IV SOLN
INTRAVENOUS | Status: DC | PRN
  Administered 2012-07-01: 5 mL via EPIDURAL

## 2012-07-01 MED ORDER — DIBUCAINE 1 % RE OINT: 1.0000 "application " | TOPICAL_OINTMENT | RECTAL | Status: DC | PRN

## 2012-07-01 MED ORDER — OXYTOCIN 10 UNIT/ML IJ SOLN
INTRAMUSCULAR | Status: AC
  Filled 2012-07-01: qty 4

## 2012-07-01 MED ORDER — LABETALOL HCL 5 MG/ML IV SOLN
10.0000 mg | INTRAVENOUS | Status: DC | PRN
  Administered 2012-07-01 (×2): 10 mg via INTRAVENOUS

## 2012-07-01 MED ORDER — ONDANSETRON HCL 4 MG/2ML IJ SOLN
4.0000 mg | INTRAMUSCULAR | Status: DC | PRN
  Administered 2012-07-01: 4 mg via INTRAVENOUS
  Filled 2012-07-01: qty 2

## 2012-07-01 MED ORDER — IBUPROFEN 800 MG PO TABS
800.0000 mg | ORAL_TABLET | Freq: Three times a day (TID) | ORAL | Status: DC
  Administered 2012-07-01 – 2012-07-04 (×8): 800 mg via ORAL
  Filled 2012-07-01 (×8): qty 1

## 2012-07-01 MED ORDER — ACETAMINOPHEN 500 MG PO TABS
1000.0000 mg | ORAL_TABLET | Freq: Once | ORAL | Status: AC
  Administered 2012-07-01: 1000 mg via ORAL
  Filled 2012-07-01: qty 2

## 2012-07-01 MED ORDER — LACTATED RINGERS IV SOLN
INTRAVENOUS | Status: DC | PRN
  Administered 2012-07-01 (×3): via INTRAVENOUS

## 2012-07-01 MED ORDER — LACTATED RINGERS IV SOLN: INTRAVENOUS | Status: DC

## 2012-07-01 MED ORDER — FENTANYL CITRATE 0.05 MG/ML IJ SOLN: 25.0000 ug | INTRAMUSCULAR | Status: DC | PRN

## 2012-07-01 MED ORDER — KETOROLAC TROMETHAMINE 30 MG/ML IJ SOLN: 30.0000 mg | Freq: Four times a day (QID) | INTRAMUSCULAR | Status: DC | PRN

## 2012-07-01 MED ORDER — LABETALOL HCL 5 MG/ML IV SOLN
INTRAVENOUS | Status: AC
  Administered 2012-07-01: 5 mg via INTRAVENOUS
  Filled 2012-07-01: qty 4

## 2012-07-01 MED ORDER — LABETALOL HCL 5 MG/ML IV SOLN
INTRAVENOUS | Status: AC
  Filled 2012-07-01: qty 4

## 2012-07-01 MED ORDER — SODIUM BICARBONATE 8.4 % IV SOLN
INTRAVENOUS | Status: AC
  Filled 2012-07-01: qty 50

## 2012-07-01 MED ORDER — METOCLOPRAMIDE HCL 5 MG/ML IJ SOLN: 10.0000 mg | Freq: Three times a day (TID) | INTRAMUSCULAR | Status: DC | PRN

## 2012-07-01 MED ORDER — OXYTOCIN 10 UNIT/ML IJ SOLN
40.0000 [IU] | INTRAMUSCULAR | Status: DC | PRN
  Administered 2012-07-01: 40 [IU] via INTRAVENOUS

## 2012-07-01 MED ORDER — KETOROLAC TROMETHAMINE 30 MG/ML IJ SOLN
INTRAMUSCULAR | Status: AC
  Administered 2012-07-01: 30 mg via INTRAVENOUS
  Filled 2012-07-01: qty 1

## 2012-07-01 MED ORDER — SIMETHICONE 80 MG PO CHEW
80.0000 mg | CHEWABLE_TABLET | Freq: Three times a day (TID) | ORAL | Status: DC
  Administered 2012-07-02 – 2012-07-04 (×9): 80 mg via ORAL

## 2012-07-01 MED ORDER — ONDANSETRON HCL 4 MG PO TABS: 4.0000 mg | ORAL_TABLET | ORAL | Status: DC | PRN

## 2012-07-01 MED ORDER — SCOPOLAMINE 1 MG/3DAYS TD PT72
MEDICATED_PATCH | TRANSDERMAL | Status: AC
  Filled 2012-07-01: qty 1

## 2012-07-01 MED ORDER — ZOLPIDEM TARTRATE 5 MG PO TABS: 5.0000 mg | ORAL_TABLET | Freq: Every evening | ORAL | Status: DC | PRN

## 2012-07-01 MED ORDER — SODIUM CHLORIDE 0.9 % IJ SOLN: 3.0000 mL | INTRAMUSCULAR | Status: DC | PRN

## 2012-07-01 MED ORDER — ONDANSETRON HCL 4 MG/2ML IJ SOLN
INTRAMUSCULAR | Status: AC
  Filled 2012-07-01: qty 2

## 2012-07-01 MED ORDER — TETANUS-DIPHTH-ACELL PERTUSSIS 5-2.5-18.5 LF-MCG/0.5 IM SUSP: 0.5000 mL | Freq: Once | INTRAMUSCULAR | Status: DC

## 2012-07-01 MED ORDER — DIPHENHYDRAMINE HCL 50 MG/ML IJ SOLN: 25.0000 mg | INTRAMUSCULAR | Status: DC | PRN

## 2012-07-01 MED ORDER — SODIUM CHLORIDE 0.9 % IR SOLN
Status: DC | PRN
  Administered 2012-07-01: 1000 mL

## 2012-07-01 MED ORDER — OXYTOCIN 40 UNITS IN LACTATED RINGERS INFUSION - SIMPLE MED: 1.0000 m[IU]/min | INTRAVENOUS | Status: DC

## 2012-07-01 MED ORDER — CLINDAMYCIN PHOSPHATE 900 MG/50ML IV SOLN
900.0000 mg | Freq: Once | INTRAVENOUS | Status: AC
  Administered 2012-07-01: 900 mg via INTRAVENOUS
  Filled 2012-07-01: qty 50

## 2012-07-01 MED ORDER — PHENYLEPHRINE 40 MCG/ML (10ML) SYRINGE FOR IV PUSH (FOR BLOOD PRESSURE SUPPORT)
PREFILLED_SYRINGE | INTRAVENOUS | Status: AC
  Filled 2012-07-01: qty 5

## 2012-07-01 MED ORDER — MORPHINE SULFATE (PF) 0.5 MG/ML IJ SOLN
INTRAMUSCULAR | Status: DC | PRN
  Administered 2012-07-01: 4 mg via EPIDURAL
  Administered 2012-07-01: 1 mg via INTRAVENOUS

## 2012-07-01 MED ORDER — ONDANSETRON HCL 4 MG/2ML IJ SOLN
INTRAMUSCULAR | Status: DC | PRN
  Administered 2012-07-01: 4 mg via INTRAVENOUS

## 2012-07-01 MED ORDER — DEXTROSE 5 % IV SOLN: 1.0000 g | Freq: Two times a day (BID) | INTRAVENOUS | Status: DC

## 2012-07-01 MED ORDER — MENTHOL 3 MG MT LOZG: 1.0000 | LOZENGE | OROMUCOSAL | Status: DC | PRN

## 2012-07-01 MED ORDER — SODIUM CHLORIDE 0.9 % IV SOLN
1.0000 ug/kg/h | INTRAVENOUS | Status: DC | PRN
  Filled 2012-07-01: qty 2.5

## 2012-07-01 MED ORDER — IBUPROFEN 600 MG PO TABS: 600.0000 mg | ORAL_TABLET | Freq: Four times a day (QID) | ORAL | Status: DC | PRN

## 2012-07-01 MED ORDER — CALCIUM CARBONATE ANTACID 500 MG PO CHEW: 400.0000 mg | CHEWABLE_TABLET | ORAL | Status: DC | PRN

## 2012-07-01 MED ORDER — OXYTOCIN 40 UNITS IN LACTATED RINGERS INFUSION - SIMPLE MED: 62.5000 mL/h | INTRAVENOUS | Status: AC

## 2012-07-01 MED ORDER — DIPHENHYDRAMINE HCL 25 MG PO CAPS
25.0000 mg | ORAL_CAPSULE | ORAL | Status: DC | PRN
  Filled 2012-07-01: qty 1

## 2012-07-01 MED ORDER — MEPERIDINE HCL 25 MG/ML IJ SOLN
INTRAMUSCULAR | Status: AC
  Filled 2012-07-01: qty 1

## 2012-07-01 MED ORDER — PHENYLEPHRINE HCL 10 MG/ML IJ SOLN
INTRAMUSCULAR | Status: DC | PRN
  Administered 2012-07-01 (×2): 40 ug via INTRAVENOUS

## 2012-07-01 MED ORDER — CALCIUM CARBONATE ANTACID 750 MG PO CHEW: 2.0000 | CHEWABLE_TABLET | ORAL | Status: DC | PRN

## 2012-07-01 MED ORDER — SIMETHICONE 80 MG PO CHEW: 80.0000 mg | CHEWABLE_TABLET | ORAL | Status: DC | PRN

## 2012-07-01 MED ORDER — DEXTROSE 5 % IV SOLN
2.0000 g | INTRAVENOUS | Status: DC
  Administered 2012-07-01: 2 g via INTRAVENOUS
  Filled 2012-07-01 (×2): qty 2

## 2012-07-01 MED ORDER — SODIUM CHLORIDE 0.9 % IV SOLN
2.0000 g | Freq: Four times a day (QID) | INTRAVENOUS | Status: DC
  Administered 2012-07-01 (×2): 2 g via INTRAVENOUS
  Filled 2012-07-01 (×5): qty 2000

## 2012-07-01 MED ORDER — PRENATAL MULTIVITAMIN CH
1.0000 | ORAL_TABLET | Freq: Every morning | ORAL | Status: DC
  Administered 2012-07-02 – 2012-07-04 (×3): 1 via ORAL
  Filled 2012-07-01 (×3): qty 1

## 2012-07-01 MED ORDER — CEFAZOLIN SODIUM-DEXTROSE 2-3 GM-% IV SOLR: 2.0000 g | INTRAVENOUS | Status: DC

## 2012-07-01 MED ORDER — OXYCODONE-ACETAMINOPHEN 5-325 MG PO TABS
1.0000 | ORAL_TABLET | ORAL | Status: DC | PRN
  Administered 2012-07-04: 1 via ORAL
  Filled 2012-07-01: qty 1

## 2012-07-01 MED ORDER — MORPHINE SULFATE 0.5 MG/ML IJ SOLN
INTRAMUSCULAR | Status: AC
  Filled 2012-07-01: qty 10

## 2012-07-01 MED ORDER — LIDOCAINE-EPINEPHRINE (PF) 2 %-1:200000 IJ SOLN
INTRAMUSCULAR | Status: AC
  Filled 2012-07-01: qty 20

## 2012-07-01 MED ORDER — LABETALOL HCL 5 MG/ML IV SOLN
5.0000 mg | INTRAVENOUS | Status: AC | PRN
  Administered 2012-07-01 (×4): 5 mg via INTRAVENOUS

## 2012-07-01 MED ORDER — GENTAMICIN SULFATE 40 MG/ML IJ SOLN
140.0000 mg | Freq: Two times a day (BID) | INTRAVENOUS | Status: DC
  Administered 2012-07-01: 140 mg via INTRAVENOUS
  Filled 2012-07-01 (×3): qty 3.5

## 2012-07-01 MED ORDER — MEPERIDINE HCL 25 MG/ML IJ SOLN: 6.2500 mg | INTRAMUSCULAR | Status: DC | PRN

## 2012-07-01 MED ORDER — WITCH HAZEL-GLYCERIN EX PADS: 1.0000 "application " | MEDICATED_PAD | CUTANEOUS | Status: DC | PRN

## 2012-07-01 MED ORDER — LANOLIN HYDROUS EX OINT: 1.0000 "application " | TOPICAL_OINTMENT | CUTANEOUS | Status: DC | PRN

## 2012-07-01 MED ORDER — KETOROLAC TROMETHAMINE 30 MG/ML IJ SOLN
30.0000 mg | Freq: Four times a day (QID) | INTRAMUSCULAR | Status: DC | PRN
  Administered 2012-07-01: 30 mg via INTRAVENOUS

## 2012-07-01 MED ORDER — PRENATAL MULTIVITAMIN CH: 1.0000 | ORAL_TABLET | Freq: Every day | ORAL | Status: DC

## 2012-07-01 SURGICAL SUPPLY — 34 items
BENZOIN TINCTURE PRP APPL 2/3 (GAUZE/BANDAGES/DRESSINGS) ×2 IMPLANT
CHLORAPREP W/TINT 26ML (MISCELLANEOUS) ×2 IMPLANT
CLOTH BEACON ORANGE TIMEOUT ST (SAFETY) ×2 IMPLANT
CONTAINER PREFILL 10% NBF 15ML (MISCELLANEOUS) IMPLANT
DRESSING TELFA 8X3 (GAUZE/BANDAGES/DRESSINGS) ×2 IMPLANT
ELECT REM PT RETURN 9FT ADLT (ELECTROSURGICAL) ×2
ELECTRODE REM PT RTRN 9FT ADLT (ELECTROSURGICAL) ×1 IMPLANT
EXTRACTOR VACUUM M CUP 4 TUBE (SUCTIONS) IMPLANT
GLOVE BIO SURGEON STRL SZ 6.5 (GLOVE) ×2 IMPLANT
GLOVE BIO SURGEON STRL SZ7 (GLOVE) ×2 IMPLANT
GOWN PREVENTION PLUS LG XLONG (DISPOSABLE) ×6 IMPLANT
KIT ABG SYR 3ML LUER SLIP (SYRINGE) IMPLANT
NEEDLE HYPO 25X5/8 SAFETYGLIDE (NEEDLE) IMPLANT
NS IRRIG 1000ML POUR BTL (IV SOLUTION) ×2 IMPLANT
PACK C SECTION WH (CUSTOM PROCEDURE TRAY) ×2 IMPLANT
PAD ABD 7.5X8 STRL (GAUZE/BANDAGES/DRESSINGS) ×2 IMPLANT
RTRCTR C-SECT PINK 25CM LRG (MISCELLANEOUS) ×2 IMPLANT
SLEEVE SCD COMPRESS KNEE MED (MISCELLANEOUS) ×2 IMPLANT
STAPLER VISISTAT 35W (STAPLE) IMPLANT
STRIP CLOSURE SKIN 1/2X4 (GAUZE/BANDAGES/DRESSINGS) ×2 IMPLANT
SUT MNCRL 0 VIOLET CTX 36 (SUTURE) ×2 IMPLANT
SUT MONOCRYL 0 CTX 36 (SUTURE) ×2
SUT PLAIN 1 NONE 54 (SUTURE) IMPLANT
SUT PLAIN 2 0 XLH (SUTURE) ×2 IMPLANT
SUT VIC AB 0 CT1 27 (SUTURE) ×2
SUT VIC AB 0 CT1 27XBRD ANBCTR (SUTURE) ×2 IMPLANT
SUT VIC AB 2-0 CT1 27 (SUTURE) ×1
SUT VIC AB 2-0 CT1 TAPERPNT 27 (SUTURE) ×1 IMPLANT
SUT VIC AB 3-0 FS2 27 (SUTURE) ×2 IMPLANT
SYR BULB IRRIGATION 50ML (SYRINGE) IMPLANT
TAPE CLOTH SURG 4X10 WHT LF (GAUZE/BANDAGES/DRESSINGS) ×2 IMPLANT
TOWEL OR 17X24 6PK STRL BLUE (TOWEL DISPOSABLE) ×4 IMPLANT
TRAY FOLEY CATH 14FR (SET/KITS/TRAYS/PACK) IMPLANT
WATER STERILE IRR 1000ML POUR (IV SOLUTION) ×2 IMPLANT

## 2012-07-01 NOTE — Progress Notes (Signed)
Dr. Malen Gauze gave order to given labetalol for blood pressure's.

## 2012-07-01 NOTE — Transfer of Care (Signed)
Immediate Anesthesia Transfer of Care Note  Patient: Katherine Robbins  Procedure(s) Performed: Procedure(s) (LRB): CESAREAN SECTION (N/A)  Patient Location: PACU  Anesthesia Type: Epidural  Level of Consciousness: awake, alert  and patient cooperative  Airway & Oxygen Therapy: Patient Spontanous Breathing  Post-op Assessment: Report given to PACU RN and Post -op Vital signs reviewed and stable  Post vital signs: Reviewed  Complications: No apparent anesthesia complications

## 2012-07-01 NOTE — Progress Notes (Signed)
Informed Dr. Ellyn Hack about pt's increasing blood pressure's.. No new orders given at this time.. Will continue to monitor .Katherine Robbins Pt resting comfortably.Katherine Robbins

## 2012-07-01 NOTE — Progress Notes (Addendum)
Subjective: Postpartum Day 2: Cesarean Delivery Patient reports incisional pain, tolerating PO and no problems voiding.  Pt with HA, states worst of her life, relieved with Dilaudid/Phenergan.  Refused CT last night, agreed to it this am.     Objective: Vital signs in last 24 hours: Temp:  [98.1 F (36.7 C)-101.6 F (38.7 C)] 99.1 F (37.3 C) (08/04 0746) Pulse Rate:  [25-122] 88  (08/04 0801) Resp:  [16-20] 18  (08/04 0801) BP: (119-181)/(77-113) 138/77 mmHg (08/04 0801) SpO2:  [84 %-100 %] 87 % (08/03 1837)  Physical Exam:  General: alert and no distress Lochia: appropriate Uterine Fundus: firm Incision: healing well DVT Evaluation: No evidence of DVT seen on physical exam.   Basename 06/30/12 1615 06/30/12 0815  HGB 11.6* 12.2  HCT 34.6* 36.1    Assessment/Plan: Status post Cesarean section. Doing well postoperatively.  Continue current care.  CT today to evaluate HA, t/c neuro eval.  Baby in NICU, D20  BOVARD,Katherine Robbins 07/01/2012, 8:07 AM   Documented on Wrong patient

## 2012-07-01 NOTE — Preoperative (Signed)
Beta Blockers   Reason not to administer Beta Blockers:Not Applicable 

## 2012-07-01 NOTE — Anesthesia Postprocedure Evaluation (Signed)
  Anesthesia Post-op Note  Patient: Katherine Robbins  Procedure(s) Performed: Procedure(s) (LRB): CESAREAN SECTION (N/A)  Patient Location: PACU  Anesthesia Type: Epidural  Level of Consciousness: awake, alert  and oriented  Airway and Oxygen Therapy: Patient Spontanous Breathing  Post-op Pain: none  Post-op Assessment: Post-op Vital signs reviewed, Patient's Cardiovascular Status Stable, Respiratory Function Stable, Patent Airway, No signs of Nausea or vomiting, Pain level controlled, No headache and No backache  Post-op Vital Signs: Reviewed and stable  Complications: No apparent anesthesia complications

## 2012-07-01 NOTE — Brief Op Note (Signed)
06/30/2012 - 07/01/2012  2:05 PM  PATIENT:  Katherine Robbins  21 y.o. female  PRE-OPERATIVE DIAGNOSIS:  arrest of dilitation  POST-OPERATIVE DIAGNOSIS:  arrest of dilitation  PROCEDURE:  Procedure(s) (LRB): CESAREAN SECTION (N/A)  SURGEON:  Surgeon(s) and Role:    * Sherron Monday, MD - Primary  ANESTHESIA:   epidural  EBL:  Total I/O In: 2700 [I.V.:2700] Out:  [Urine:200; Blood:700]  FINDINGS: viable female infant at 12:52, apgars 8/9, wt 7#7.8oz, nl uterus, tubes, ovaries  BLOOD ADMINISTERED:none  DRAINS: Urinary Catheter (Foley)   LOCAL MEDICATIONS USED:  NONE  SPECIMEN:  Source of Specimen:  Placenta  DISPOSITION OF SPECIMEN:  Pathology  COUNTS:  YES  TOURNIQUET:  * No tourniquets in log *  DICTATION: .Other Dictation: Dictation Number [id number]  PLAN OF CARE: Admit to inpatient   PATIENT DISPOSITION:  PACU - hemodynamically stable.   Delay start of Pharmacological VTE agent (>24hrs) due to surgical blood loss or risk of bleeding: not applicable

## 2012-07-01 NOTE — OR Nursing (Addendum)
Uterus massaged by S. Pansy Ostrovsky Charity fundraiser. Two tubes of cord blood sent to lab. Foley catheter in place upon arrival to OR. Urine color- concentrated.  2cc of blood evacuated from uterus during uterine massage.

## 2012-07-01 NOTE — Consult Note (Signed)
ANTIBIOTIC CONSULT NOTE - INITIAL  Pharmacy Consult for Gentamicin Indication: Maternal temperature/Presumed chorioamnionitis  Allergies  Allergen Reactions  . Shellfish Allergy Itching    Pt states she is only allergic to "crab legs" can eat and use all other shell fish items    Patient Measurements: Height: 5' (152.4 cm) Weight: 175 lb (79.379 kg) IBW/kg (Calculated) : 45.5  Adjusted Body Weight: 55.7 Kg  Vital Signs: Temp: 101.6 F (38.7 C) (08/04 0401) Temp src: Oral (08/04 0401) BP: 133/81 mmHg (08/04 0401) Pulse Rate: 117  (08/04 0401) Intake/Output from previous day: 08/03 0701 - 08/04 0700 In: -  Out: 500 [Urine:500] Intake/Output from this shift: Total I/O In: -  Out: 500 [Urine:500]  Labs:  [facility] 06/30/12 1615 06/30/12 0815  WBC 12.9* 10.0  HGB 11.6* 12.2  PLT 190 214  LABCREA -- --  CREATININE 0.96 0.82   Estimated Creatinine Clearance: 86.5 ml/min (by C-G formula based on Cr of 0.96). No results found for this basename: VANCOTROUGH:2,VANCOPEAK:2,VANCORANDOM:2,GENTTROUGH:2,GENTPEAK:2,GENTRANDOM:2,TOBRATROUGH:2,TOBRAPEAK:2,TOBRARND:2,AMIKACINPEAK:2,AMIKACINTROU:2,AMIKACIN:2, in the last 72 hours   Microbiology: Recent Results (from the past 720 hour(s))  OB RESULTS CONSOLE GBS     Status: Normal      Component Value Range Status Comment   GBS Negative        Medical History: Past Medical History  Diagnosis Date  . Ovarian cyst   . Chlamydia   . Blunt abdominal trauma 06/25/2012  . Normal pregnancy 06/25/2012    Medications:  Ampicillin 2 Gm IV every 6 hours  Assessment: This patient is a 21 yo G1P0 at @ [redacted] weeks EGA in labor, S/P fall with abdominal trauma, who now has an increased temperature    Goal of Therapy:  Gentamicin peaks 6-8 mcg/ml; troughs <1 mcg/ml. Monitor renal function closely due to increasing serum creatinine: 0.82 mg/dl (drawn 1/61 0960) 4.54 mg/dl (drawn 0/98 1191) Plan:  Gentamicin 140 mg IV every 12 hours We will  check a serum creatinine with next lab draw if patient is still on gentamicin Serum gentamicin levels as indicated   Arelia Sneddon 07/01/2012,4:31 AM

## 2012-07-01 NOTE — Progress Notes (Signed)
Patient ID: Katherine Robbins, female   DOB: [birth year], 21 y.o.   MRN: [id number] Pt w/o cervical change for > 12 hr, agrees to LTCS.  D/w pt r/b/a

## 2012-07-01 NOTE — Progress Notes (Signed)
Patient ID: Katherine Robbins, female   DOB: 26-Feb-1991, 21 y.o.   MRN: [id number] Comfortable with epidural.   AF VSS gen NAD FHTs 140's, mod var toco Q 2  SVE 7.8/90/+1 - caput to +2-3  D/w pt little/no cervical change for 12hrs, arrest of dilitation - r/b/a of primary LTCS, including but not limited to bleeding, infection, damage to surrounding organs, injury to baby, trouble healing.  Pt voices understanding desires to wait and be reevaluated at noon.  Agreed as long as baby is still stable, will reeval at noon

## 2012-07-02 ENCOUNTER — Encounter (HOSPITAL_COMMUNITY): Payer: Self-pay | Admitting: Obstetrics and Gynecology

## 2012-07-02 LAB — CBC
HCT: 25.5 % — ABNORMAL LOW (ref 36.0–46.0)
Hemoglobin: 8.8 g/dL — ABNORMAL LOW (ref 12.0–15.0)
MCH: 30 pg (ref 26.0–34.0)
RBC: 2.93 MIL/uL — ABNORMAL LOW (ref 3.87–5.11)

## 2012-07-02 MED ORDER — DEXTROSE 5 % IV SOLN
2.0000 mg | Freq: Once | INTRAVENOUS | Status: DC
  Filled 2012-07-02: qty 0

## 2012-07-02 MED ORDER — RHO D IMMUNE GLOBULIN 1500 UNIT/2ML IJ SOLN
300.0000 ug | Freq: Once | INTRAMUSCULAR | Status: AC
  Administered 2012-07-02: 300 ug via INTRAMUSCULAR
  Filled 2012-07-02: qty 2

## 2012-07-02 NOTE — Op Note (Signed)
NAMEALESHIA, CARTELLI NO.:  [id number]  MEDICAL RECORD NO.:  [id number]  LOCATION:  9120                          FACILITY:  [facility]  PHYSICIAN:  Sherron Monday, MD        DATE OF BIRTH:  04/18/1991  DATE OF PROCEDURE:  07/01/2012 DATE OF DISCHARGE:                              OPERATIVE REPORT   PREOPERATIVE DIAGNOSIS:  Intrauterine pregnancy at term, failed induction, arrest of dilatation.  POSTOPERATIVE DIAGNOSIS:  Intrauterine pregnancy at term, failed induction, arrest of dilatation, delivered.  PROCEDURE:  Primary low transverse cesarean section.  SURGEON:  Sherron Monday, MD  ANESTHESIA:  Epidural.  IV FLUIDS:  2700 mL.  URINE OUTPUT:  200 mL.  Clear urine at the end of procedure.  ESTIMATED BLOOD LOSS:  700 mL.  FINDINGS:  Viable female infant at 29 with Apgars of 8 at 1 minute and 9 at 5 minutes, and a weight of 7 pounds 7.8 ounces.  Normal uterus, tubes, and ovaries are noted.  COMPLICATIONS:  None.  PATHOLOGY:  Placenta to Pathology.  PROCEDURE:  After informed consent was reviewed, the patient and her family including risks, benefits, and alternatives, including but not limited to bleeding, infection, damage to surrounding organs, trouble healing and injury infant.  She was transferred to the OR, after epidural had been re-dose, placed on the table in the supine position with a leftward tilt.  A Foley catheter had sterilely been placed prior. She was prepped and draped in the normal sterile fashion.  The Pfannenstiel skin incision was made at the level approximately 2 fingerbreadths above the pubic symphysis, carried through the underlying layer of fascia sharply.  Fascia was incised in the midline.  The midline incision was extended laterally with Mayo scissors.  The inferior aspect of the fascial incision was grasped with Kocher clamps, elevated, and the rectus muscles were dissected off both bluntly and sharply.  Attention was then turned to  the superior portion of fascial incision, which in a similar fashion was grasped with Kocher clamps, elevated, and the rectus muscles were dissected off both bluntly and sharply.  Peritoneum was entered bluntly.  Alexis skin retractor was placed carefully making sure no bowel was entrapped.  The uterus was inspected.  The vesicouterine peritoneum was easily identified, tented up with pickups.  The bladder flap was created both digitally and sharply.  Uterus was incised in transverse fashion.  Infant was delivered from the vertex presentation with the aide of a vacuum.  Nose and mouth were suctioned on the field.  Cord was clamped and cut. Infant was handed off to the awaiting pediatric staff.  The placenta was expressed.  The uterus was cleared of all clot and debris.  The uterine incision had been noted to have a cervical extension, this was closed with the uterine incision and 2 layers of 0 Monocryl, the 1st of which is a running locked and the 2nd as an imbricating.  Hemostasis was assured.  Copious pelvic irrigation was performed.  The peritoneum and rectus muscles were reapproximated using 2-0 Vicryl.  The subfascial planes were inspected and found to be hemostatic.  The fascia was closed with a single suture of  0 Vicryl.  The subcuticular adipose layer was made hemostatic with Bovie cautery.  The dead space was reapproximated with 3-0 plain gut.  The skin was closed with 3-0 Vicryl in a subcuticular fashion.  Benzoin and Steri-Strips were applied.  Sponge, lap, and needle counts were correct x2 at the end of the procedure for operating room staff.     Sherron Monday, MD     JB/MEDQ  D:  07/01/2012  T:  07/02/2012  Job:  [id number]

## 2012-07-02 NOTE — Anesthesia Postprocedure Evaluation (Signed)
  Anesthesia Post-op Note  Patient: Katherine Robbins  Procedure(s) Performed: Procedure(s) (LRB): CESAREAN SECTION (N/A)  Patient Location: Mother/Baby  Anesthesia Type: Epidural  Level of Consciousness: awake  Airway and Oxygen Therapy: Patient Spontanous Breathing  Post-op Pain: none  Post-op Assessment: Patient's Cardiovascular Status Stable, Respiratory Function Stable, Patent Airway, No signs of Nausea or vomiting, Adequate PO intake, Pain level controlled, No headache, No backache, No residual numbness and No residual motor weakness  Post-op Vital Signs: Reviewed and stable  Complications: No apparent anesthesia complications

## 2012-07-02 NOTE — Progress Notes (Signed)
UR chart review completed.  

## 2012-07-02 NOTE — Progress Notes (Addendum)
Subjective: Postpartum Day 1: Cesarean Delivery Patient reports incisional pain and tolerating PO.  Nl lochia, pain controlled  Objective: Vital signs in last 24 hours: Temp:  [97.9 F (36.6 C)-99.7 F (37.6 C)] 97.9 F (36.6 C) (08/05 0430) Pulse Rate:  [72-109] 99  (08/05 0440) Resp:  [18-28] 18  (08/05 0430) BP: (107-170)/(69-113) 107/69 mmHg (08/05 0440) SpO2:  [94 %-99 %] 96 % (08/05 0430)  Physical Exam:  General: alert and no distress Lochia: appropriate Uterine Fundus: firm Incision: healing well DVT Evaluation: No evidence of DVT seen on physical exam.   Basename 07/02/12 0530 06/30/12 1615  HGB 8.8* 11.6*  HCT 25.5* 34.6*    Assessment/Plan: Status post Cesarean section. Doing well postoperatively.  Continue current care. Abx for 2 doses given chorio.    BOVARD,Riely Baskett 07/02/2012, 7:54 AM

## 2012-07-02 NOTE — Addendum Note (Signed)
Addendum  created 07/02/12 1632 by Suella Grove, CRNA   Modules edited:Notes Section

## 2012-07-03 LAB — COMPREHENSIVE METABOLIC PANEL
Albumin: 2.4 g/dL — ABNORMAL LOW (ref 3.5–5.2)
BUN: 10 mg/dL (ref 6–23)
Creatinine, Ser: 1.04 mg/dL (ref 0.50–1.10)
Potassium: 4 mEq/L (ref 3.5–5.1)
Total Protein: 5.7 g/dL — ABNORMAL LOW (ref 6.0–8.3)

## 2012-07-03 LAB — RH IG WORKUP (INCLUDES ABO/RH)
Gestational Age(Wks): 39.3
Unit division: 0

## 2012-07-03 LAB — CBC
HCT: 28.6 % — ABNORMAL LOW (ref 36.0–46.0)
MCHC: 33.9 g/dL (ref 30.0–36.0)
MCV: 88 fL (ref 78.0–100.0)
RDW: 13.9 % (ref 11.5–15.5)

## 2012-07-03 NOTE — Progress Notes (Signed)
Pt reluctant to have straight cath specimen collected (per MD order) because the catheter she had in labor "hurt so much." Procedure explained to pt, straight cath shown to pt, and she agrees to procedure. #8 Fr straight cath easily inserted for app 3 ml clear urine. Pt tolerated procedure well.

## 2012-07-03 NOTE — Progress Notes (Signed)
Subjective: Postpartum Day 2: Cesarean Delivery Patient reports nausea, vomiting, incisional pain, tolerating PO and no problems voiding.  Nl lochia, pain controleld  Objective: Vital signs in last 24 hours: Temp:  [97.7 F (36.5 C)-98.5 F (36.9 C)] 97.7 F (36.5 C) (08/06 0537) Pulse Rate:  [80-98] 89  (08/06 0537) Resp:  [16-20] 18  (08/06 0537) BP: (108-131)/(72-90) 120/85 mmHg (08/06 0537) SpO2:  [96 %-98 %] 98 % (08/05 1245)  Physical Exam:  General: alert and no distress Lochia: appropriate Uterine Fundus: firm Incision: healing well DVT Evaluation: No evidence of DVT seen on physical exam.   Basename 07/02/12 0530 06/30/12 1615  HGB 8.8* 11.6*  HCT 25.5* 34.6*    Assessment/Plan: Status post Cesarean section. Doing well postoperatively.  Continue current care.  BOVARD,Deakin Lacek 07/03/2012, 8:22 AM

## 2012-07-04 LAB — PROTEIN, URINE, RANDOM: Total Protein, Urine: 6 mg/dL

## 2012-07-04 MED ORDER — PRENATAL MULTIVITAMIN CH: 1.0000 | ORAL_TABLET | Freq: Every morning | ORAL | Status: DC

## 2012-07-04 MED ORDER — OXYCODONE-ACETAMINOPHEN 5-325 MG PO TABS: 1.0000 | ORAL_TABLET | Freq: Four times a day (QID) | ORAL | Status: AC | PRN

## 2012-07-04 MED ORDER — RHO D IMMUNE GLOBULIN 1500 UNIT/2ML IJ SOLN
300.0000 ug | Freq: Once | INTRAMUSCULAR | Status: DC
  Filled 2012-07-04: qty 2

## 2012-07-04 MED ORDER — IBUPROFEN 800 MG PO TABS: 800.0000 mg | ORAL_TABLET | Freq: Three times a day (TID) | ORAL | Status: AC | PRN

## 2012-07-04 NOTE — Discharge Summary (Signed)
Obstetric Discharge Summary Reason for Admission: induction of labor Prenatal Procedures: none Intrapartum Procedures: cesarean: low cervical, transverse Postpartum Procedures: Rho(D) Ig Complications-Operative and Postpartum: elevated BP, PIH labs Hemoglobin  Date Value Range Status  07/03/2012 9.7* 12.0 - 15.0 g/dL Final     HCT  Date Value Range Status  07/03/2012 28.6* 36.0 - 46.0 % Final    Physical Exam:  General: alert and no distress Lochia: appropriate Uterine Fundus: firm Incision: healing well DVT Evaluation: No evidence of DVT seen on physical exam.  Discharge Diagnoses: Term Pregnancy-delivered  Discharge Information: Date: 07/04/2012 Activity: pelvic rest Diet: routine Medications: PNV, Ibuprofen and Percocet Condition: stable Instructions: refer to practice specific booklet Discharge to: home Follow-up Information    Follow up with BOVARD,Michaeljoseph Revolorio, MD. Schedule an appointment as soon as possible for a visit in 2 weeks.   Contact information:   [address] [facility] [facility] [address] [place] [address] [phone]          Newborn Data: Live born female  Birth Weight: 7 lb 7.8 oz (3395 g) APGAR: 8, 9  Home with mother.  BOVARD,Abbee Cremeens 07/04/2012, 9:23 AM

## 2012-07-04 NOTE — Progress Notes (Signed)
Dr Senaida Ores was called due to patient's blood pressure being elevated despite 30 minutes of rest in between readings.  Patient denied any symptoms of pain, visual changes, epigastric pain, or any other symptoms.  Her reflexes were 2+ and she had no clonus.  Dr Senaida Ores ordered labs, CBC, CMET and cath UA.  Patient was informed of these new orders due to her BP elevation.  She verbalized understanding.  Will report off to oncoming RN and MD will be notified with further concerns and prn.

## 2012-07-04 NOTE — Progress Notes (Signed)
Subjective: Postpartum Day 3: Cesarean Delivery Patient reports incisional pain, tolerating PO and no problems voiding.  Nl lochia, pain controleld  Objective: Vital signs in last 24 hours: Temp:  [97.6 F (36.4 C)-98.2 F (36.8 C)] 98.2 F (36.8 C) (08/07 0537) Pulse Rate:  [72-88] 74  (08/07 0537) Resp:  [18-20] 20  (08/07 0537) BP: (137-163)/(69-102) 155/92 mmHg (08/07 0537) SpO2:  [98 %] 98 % (08/07 0020)  Physical Exam:  General: alert and no distress Lochia: appropriate Uterine Fundus: firm Incision: healing well DVT Evaluation: No evidence of DVT seen on physical exam.   Basename 07/03/12 2205 07/02/12 0530  HGB 9.7* 8.8*  HCT 28.6* 25.5*    Assessment/Plan: Status post Cesarean section. Doing well postoperatively. Some elevated BP o/n, nl PIH labs.   Discharge home with standard precautions and return to clinic in 2 weeks.  D/c with Motrin/Percocet/PNV  BOVARD,Winferd Wease 07/04/2012, 9:11 AM

## 2013-10-23 ENCOUNTER — Ambulatory Visit: Payer: Medicaid Other

## 2014-09-29 ENCOUNTER — Encounter (HOSPITAL_COMMUNITY): Payer: Self-pay | Admitting: Obstetrics and Gynecology

## 2017-02-24 ENCOUNTER — Encounter (HOSPITAL_COMMUNITY): Payer: Self-pay | Admitting: *Deleted

## 2017-02-24 ENCOUNTER — Inpatient Hospital Stay (HOSPITAL_COMMUNITY)
Admission: AD | Admit: 2017-02-24 | Discharge: 2017-02-24 | Disposition: A | Discharge status: Home / Self Care | Payer: Self-pay | Source: Ambulatory Visit | Attending: Obstetrics and Gynecology | Admitting: Obstetrics and Gynecology

## 2017-02-24 ENCOUNTER — Inpatient Hospital Stay (HOSPITAL_COMMUNITY): Payer: Self-pay

## 2017-02-24 DIAGNOSIS — Z9889 Other specified postprocedural states: Secondary | ICD-10-CM | POA: Insufficient documentation

## 2017-02-24 DIAGNOSIS — R109 Unspecified abdominal pain: Secondary | ICD-10-CM | POA: Insufficient documentation

## 2017-02-24 DIAGNOSIS — Z833 Family history of diabetes mellitus: Secondary | ICD-10-CM | POA: Insufficient documentation

## 2017-02-24 DIAGNOSIS — Z79899 Other long term (current) drug therapy: Secondary | ICD-10-CM | POA: Insufficient documentation

## 2017-02-24 DIAGNOSIS — O209 Hemorrhage in early pregnancy, unspecified: Secondary | ICD-10-CM | POA: Insufficient documentation

## 2017-02-24 DIAGNOSIS — O3680X0 Pregnancy with inconclusive fetal viability, not applicable or unspecified: Secondary | ICD-10-CM

## 2017-02-24 DIAGNOSIS — Z91013 Allergy to seafood: Secondary | ICD-10-CM | POA: Insufficient documentation

## 2017-02-24 DIAGNOSIS — O26891 Other specified pregnancy related conditions, first trimester: Secondary | ICD-10-CM

## 2017-02-24 DIAGNOSIS — O26851 Spotting complicating pregnancy, first trimester: Secondary | ICD-10-CM

## 2017-02-24 DIAGNOSIS — O26892 Other specified pregnancy related conditions, second trimester: Secondary | ICD-10-CM | POA: Insufficient documentation

## 2017-02-24 DIAGNOSIS — Z3A01 Less than 8 weeks gestation of pregnancy: Secondary | ICD-10-CM | POA: Insufficient documentation

## 2017-02-24 DIAGNOSIS — N898 Other specified noninflammatory disorders of vagina: Secondary | ICD-10-CM | POA: Insufficient documentation

## 2017-02-24 DIAGNOSIS — Z8489 Family history of other specified conditions: Secondary | ICD-10-CM | POA: Insufficient documentation

## 2017-02-24 DIAGNOSIS — O26899 Other specified pregnancy related conditions, unspecified trimester: Secondary | ICD-10-CM

## 2017-02-24 LAB — URINALYSIS, ROUTINE W REFLEX MICROSCOPIC
BILIRUBIN URINE: NEGATIVE
GLUCOSE, UA: NEGATIVE mg/dL
Ketones, ur: 5 mg/dL — AB
LEUKOCYTES UA: NEGATIVE
NITRITE: NEGATIVE
PH: 6 (ref 5.0–8.0)
Protein, ur: NEGATIVE mg/dL
Specific Gravity, Urine: 1.021 (ref 1.005–1.030)

## 2017-02-24 LAB — CBC
HEMATOCRIT: 38.1 % (ref 36.0–46.0)
Hemoglobin: 13.2 g/dL (ref 12.0–15.0)
MCH: 31.3 pg (ref 26.0–34.0)
MCHC: 34.6 g/dL (ref 30.0–36.0)
MCV: 90.3 fL (ref 78.0–100.0)
Platelets: 276 10*3/uL (ref 150–400)
RBC: 4.22 MIL/uL (ref 3.87–5.11)
RDW: 12.7 % (ref 11.5–15.5)
WBC: 9.7 10*3/uL (ref 4.0–10.5)

## 2017-02-24 LAB — WET PREP, GENITAL
CLUE CELLS WET PREP: NONE SEEN
Sperm: NONE SEEN
Trich, Wet Prep: NONE SEEN
Yeast Wet Prep HPF POC: NONE SEEN

## 2017-02-24 LAB — HCG, QUANTITATIVE, PREGNANCY: hCG, Beta Chain, Quant, S: 585 m[IU]/mL — ABNORMAL HIGH (ref <5)

## 2017-02-24 LAB — POCT PREGNANCY, URINE: Preg Test, Ur: POSITIVE — AB

## 2017-02-24 MED ORDER — RHO D IMMUNE GLOBULIN 1500 UNIT/2ML IJ SOSY
300.0000 ug | PREFILLED_SYRINGE | Freq: Once | INTRAMUSCULAR | Status: AC
  Administered 2017-02-24: 300 ug via INTRAMUSCULAR
  Filled 2017-02-24: qty 2

## 2017-02-24 NOTE — Discharge Instructions (Signed)
First Trimester of Pregnancy The first trimester of pregnancy is from week 1 until the end of week 13 (months 1 through 3). During this time, your baby will begin to develop inside you. At 6-8 weeks, the eyes and face are formed, and the heartbeat can be seen on ultrasound. At the end of 12 weeks, all the baby's organs are formed. Prenatal care is all the medical care you receive before the birth of your baby. Make sure you get good prenatal care and follow all of your doctor's instructions. Follow these instructions at home: Medicines  Take over-the-counter and prescription medicines only as told by your doctor. Some medicines are safe and some medicines are not safe during pregnancy.  Take a prenatal vitamin that contains at least 600 micrograms (mcg) of folic acid.  If you have trouble pooping (constipation), take medicine that will make your stool soft (stool softener) if your doctor approves. Eating and drinking  Eat regular, healthy meals.  Your doctor will tell you the amount of weight gain that is right for you.  Avoid raw meat and uncooked cheese.  If you feel sick to your stomach (nauseous) or throw up (vomit): ? Eat 4 or 5 small meals a day instead of 3 large meals. ? Try eating a few soda crackers. ? Drink liquids between meals instead of during meals.  To prevent constipation: ? Eat foods that are high in fiber, like fresh fruits and vegetables, whole grains, and beans. ? Drink enough fluids to keep your pee (urine) clear or pale yellow. Activity  Exercise only as told by your doctor. Stop exercising if you have cramps or pain in your lower belly (abdomen) or low back.  Do not exercise if it is too hot, too humid, or if you are in a place of great height (high altitude).  Try to avoid standing for long periods of time. Move your legs often if you must stand in one place for a long time.  Avoid heavy lifting.  Wear low-heeled shoes. Sit and stand up straight.  You  can have sex unless your doctor tells you not to. Relieving pain and discomfort  Wear a good support bra if your breasts are sore.  Take warm water baths (sitz baths) to soothe pain or discomfort caused by hemorrhoids. Use hemorrhoid cream if your doctor says it is okay.  Rest with your legs raised if you have leg cramps or low back pain.  If you have puffy, bulging veins (varicose veins) in your legs: ? Wear support hose or compression stockings as told by your doctor. ? Raise (elevate) your feet for 15 minutes, 3-4 times a day. ? Limit salt in your food. Prenatal care  Schedule your prenatal visits by the twelfth week of pregnancy.  Write down your questions. Take them to your prenatal visits.  Keep all your prenatal visits as told by your doctor. This is important. Safety  Wear your seat belt at all times when driving.  Make a list of emergency phone numbers. The list should include numbers for family, friends, the hospital, and police and fire departments. General instructions  Ask your doctor for a referral to a local prenatal class. Begin classes no later than at the start of month 6 of your pregnancy.  Ask for help if you need counseling or if you need help with nutrition. Your doctor can give you advice or tell you where to go for help.  Do not use hot tubs, steam rooms, or   saunas.  Do not douche or use tampons or scented sanitary pads.  Do not cross your legs for long periods of time.  Avoid all herbs and alcohol. Avoid drugs that are not approved by your doctor.  Do not use any tobacco products, including cigarettes, chewing tobacco, and electronic cigarettes. If you need help quitting, ask your doctor. You may get counseling or other support to help you quit.  Avoid cat litter boxes and soil used by cats. These carry germs that can cause birth defects in the baby and can cause a loss of your baby (miscarriage) or stillbirth.  Visit your dentist. At home, brush  your teeth with a soft toothbrush. Be gentle when you floss. Contact a doctor if:  You are dizzy.  You have mild cramps or pressure in your lower belly.  You have a nagging pain in your belly area.  You continue to feel sick to your stomach, you throw up, or you have watery poop (diarrhea).  You have a bad smelling fluid coming from your vagina.  You have pain when you pee (urinate).  You have increased puffiness (swelling) in your face, hands, legs, or ankles. Get help right away if:  You have a fever.  You are leaking fluid from your vagina.  You have spotting or bleeding from your vagina.  You have very bad belly cramping or pain.  You gain or lose weight rapidly.  You throw up blood. It may look like coffee grounds.  You are around people who have German measles, fifth disease, or chickenpox.  You have a very bad headache.  You have shortness of breath.  You have any kind of trauma, such as from a fall or a car accident. Summary  The first trimester of pregnancy is from week 1 until the end of week 13 (months 1 through 3).  To take care of yourself and your unborn baby, you will need to eat healthy meals, take medicines only if your doctor tells you to do so, and do activities that are safe for you and your baby.  Keep all follow-up visits as told by your doctor. This is important as your doctor will have to ensure that your baby is healthy and growing well. This information is not intended to replace advice given to you by your health care provider. Make sure you discuss any questions you have with your health care provider. Document Released: 05/02/2008 Document Revised: 11/22/2016 Document Reviewed: 11/22/2016 Elsevier Interactive Patient Education  2017 [employer]  

## 2017-02-24 NOTE — MAU Note (Addendum)
Abd cramping for a wk. Has had  some pink vag d/c for 3 days. Period late. Took 2 pregnancy tests today and positive. Ibuprofen not helping the pain

## 2017-02-24 NOTE — MAU Provider Note (Signed)
History     CSN: [id number]  Arrival date and time: 02/24/17 4098   First Provider Initiated Contact with Patient 02/24/17 2048      Chief Complaint  Patient presents with  . Abdominal Cramping  . Vaginal Discharge  . Possible Pregnancy   HPI [name] is a 26 y.o. G2P1001 at [redacted]w[redacted]d who presents to MAU today with complaint of cramping and pink discharge. The cramping started 2 weeks ago. She rates her pain at 5/10 now. She has tried Ibuprofen without relief. She has also noted a light pink discharge. She denies heavy bleeding. She denies UTI symptoms, fever, N/V today, although she does have some intermittent N/V recently. She also denies diarrhea or constipation.   OB History    Gravida Para Term Preterm AB Living   0 0 1   SAB TAB Ectopic Multiple Live Births   0 0 0 0 1      Past Medical History:  Diagnosis Date  . Blunt abdominal trauma 06/25/2012  . Chlamydia   . Normal pregnancy 06/25/2012  . Ovarian cyst   . S/P cesarean section 07/01/2012    Past Surgical History:  Procedure Laterality Date  . CESAREAN SECTION  07/01/2012   Procedure: CESAREAN SECTION;  Surgeon: Sherron Monday, MD;  Location: [facility];  Service: Gynecology;  Laterality: N/A;  Primary cesarean section with delivery of baby boy at 24.  Marland Kitchen NO PAST SURGERIES      Family History  Problem Relation Age of Onset  . Diabetes Paternal Grandmother   . Anesthesia problems Neg Hx     Social History  Substance Use Topics  . Smoking status: Never Smoker  . Smokeless tobacco: Never Used  . Alcohol use No    Allergies:  Allergies  Allergen Reactions  . Shellfish Allergy Itching    Pt states she is only allergic to "crab legs" can eat and use all other shell fish items    Prescriptions Prior to Admission  Medication Sig Dispense Refill Last Dose  . acetaminophen (TYLENOL) 500 MG tablet Take 1,000 mg by mouth once. For leg pain   06/29/2012 at Unknown  . calcium carbonate (TUMS EX) 750 MG  chewable tablet Chew 2 tablets by mouth as needed. Used for heartburn.   Past Month at Unknown  . Prenatal Vit-Fe Fumarate-FA (PRENATAL MULTIVITAMIN) TABS Take 1 tablet by mouth every morning.   06/29/2012 at Unknown  . Prenatal Vit-Fe Fumarate-FA (PRENATAL MULTIVITAMIN) TABS Take 1 tablet by mouth every morning. 30 tablet 12     Review of Systems  Constitutional: Negative for fever.  Gastrointestinal: Positive for abdominal pain. Negative for constipation, diarrhea, nausea and vomiting.  Genitourinary: Positive for vaginal bleeding. Negative for dysuria, frequency, urgency and vaginal discharge.   Physical Exam   Blood pressure 115/75, pulse 86, temperature 98.5 F (36.9 C), temperature source Oral, resp. rate 18, height 5' (1.524 m), weight 147 lb (66.7 kg), last menstrual period 01/20/2017, unknown if currently breastfeeding.  Physical Exam  Nursing note and vitals reviewed. Constitutional: She is oriented to person, place, and time. She appears well-developed and well-nourished. No distress.  HENT:  Head: Normocephalic and atraumatic.  Cardiovascular: Normal rate.   Respiratory: Effort normal.  GI: Soft. Bowel sounds are normal. She exhibits no distension and no mass. There is no tenderness. There is no rebound and no guarding.  Genitourinary: Uterus is not enlarged and not tender. Cervix exhibits no motion tenderness, no discharge and no friability.  Right adnexum displays no mass and no tenderness. Left adnexum displays no mass and no tenderness. There is bleeding (scant, light brown) in the vagina. Vaginal discharge (small thin, white discharge) found.  Genitourinary Comments: Cervix: closed, thick, firm, posterior  Neurological: She is alert and oriented to person, place, and time.  Skin: Skin is warm and dry. No erythema.  Psychiatric: She has a normal mood and affect.     Results for orders placed or performed during the hospital encounter of 02/24/17 (from the past 24 hour(s))   Urinalysis, Routine w reflex microscopic     Status: Abnormal   Collection Time: 02/24/17  7:35 PM  Result Value Ref Range   Color, Urine YELLOW YELLOW   APPearance HAZY (A) CLEAR   Specific Gravity, Urine 1.021 1.005 - 1.030   pH 6.0 5.0 - 8.0   Glucose, UA NEGATIVE NEGATIVE mg/dL   Hgb urine dipstick MODERATE (A) NEGATIVE   Bilirubin Urine NEGATIVE NEGATIVE   Ketones, ur 5 (A) NEGATIVE mg/dL   Protein, ur NEGATIVE NEGATIVE mg/dL   Nitrite NEGATIVE NEGATIVE   Leukocytes, UA NEGATIVE NEGATIVE   RBC / HPF 0-5 0 - 5 RBC/hpf   WBC, UA 0-5 0 - 5 WBC/hpf   Bacteria, UA RARE (A) NONE SEEN   Squamous Epithelial / LPF 6-30 (A) NONE SEEN  Pregnancy, urine POC     Status: Abnormal   Collection Time: 02/24/17  7:45 PM  Result Value Ref Range   Preg Test, Ur POSITIVE (A) NEGATIVE  hCG, quantitative, pregnancy     Status: Abnormal   Collection Time: 02/24/17  8:08 PM  Result Value Ref Range   hCG, Beta Chain, Quant, S 585 (H) <5 mIU/mL  CBC     Status: None   Collection Time: 02/24/17  8:08 PM  Result Value Ref Range   WBC 9.7 4.0 - 10.5 K/uL   RBC 4.22 3.87 - 5.11 MIL/uL   Hemoglobin 13.2 12.0 - 15.0 g/dL   HCT 16.1 09.6 - 04.5 %   MCV 90.3 78.0 - 100.0 fL   MCH 31.3 26.0 - 34.0 pg   MCHC 34.6 30.0 - 36.0 g/dL   RDW 40.9 81.1 - 91.4 %   Platelets 276 150 - 400 K/uL  Rh IG workup (includes ABO/Rh)     Status: None (Preliminary result)   Collection Time: 02/24/17  8:08 PM  Result Value Ref Range   Gestational Age(Wks) 22.3    ABO/RH(D) O NEG    Antibody Screen NEG    Fetal Screen PENDING   Wet prep, genital     Status: Abnormal   Collection Time: 02/24/17  9:00 PM  Result Value Ref Range   Yeast Wet Prep HPF POC NONE SEEN NONE SEEN   Trich, Wet Prep NONE SEEN NONE SEEN   Clue Cells Wet Prep HPF POC NONE SEEN NONE SEEN   WBC, Wet Prep HPF POC MODERATE (A) NONE SEEN   Sperm NONE SEEN    US Ob Comp Less 14 Wks  Result Date: 02/24/2017 CLINICAL DATA:  Acute onset of vaginal  spotting and pelvic cramping. Initial encounter. EXAM: OBSTETRIC <14 WK [place] AND TRANSVAGINAL OB US TECHNIQUE: Both transabdominal and transvaginal ultrasound examinations were performed for complete evaluation of the gestation as well as the maternal uterus, adnexal regions, and pelvic cul-de-sac. Transvaginal technique was performed to assess early pregnancy. COMPARISON:  Pelvic ultrasound performed 06/25/2012 FINDINGS: Intrauterine gestational sac: A likely tiny intrauterine gestational sac is noted. Yolk sac:  No Embryo:  No Cardiac Activity: N/A MSD: 2.9  mm   4 w   6  d Subchorionic hemorrhage:  None visualized. Maternal uterus/adnexae: The uterus is otherwise unremarkable in appearance. The ovaries are within normal limits. The right ovary measures 3.0 x 2.2 x 1.9 cm, while the left ovary measures 2.6 x 1.8 x 1.8 cm. No suspicious adnexal masses are seen; there is no evidence for ovarian torsion. No free fluid is seen within the pelvic cul-de-sac. IMPRESSION: Single tiny intrauterine gestational sac suggested. The mean sac diameter of 3 mm corresponds to a gestational age of [redacted] weeks 6 days, which matches the gestational age of [redacted] weeks 0 days by LMP, reflecting an estimated date of delivery of October 27, 2017. No yolk sac or embryo is yet seen. Electronically Signed   By: Roanna Raider M.D.   On: 02/24/2017 21:56   US Ob Transvaginal  Result Date: 02/24/2017 CLINICAL DATA:  Acute onset of vaginal spotting and pelvic cramping. Initial encounter. EXAM: OBSTETRIC <14 WK [place] AND TRANSVAGINAL OB US TECHNIQUE: Both transabdominal and transvaginal ultrasound examinations were performed for complete evaluation of the gestation as well as the maternal uterus, adnexal regions, and pelvic cul-de-sac. Transvaginal technique was performed to assess early pregnancy. COMPARISON:  Pelvic ultrasound performed 06/25/2012 FINDINGS: Intrauterine gestational sac: A likely tiny intrauterine gestational sac is noted. Yolk sac:   No Embryo:  No Cardiac Activity: N/A MSD: 2.9  mm   4 w   6  d Subchorionic hemorrhage:  None visualized. Maternal uterus/adnexae: The uterus is otherwise unremarkable in appearance. The ovaries are within normal limits. The right ovary measures 3.0 x 2.2 x 1.9 cm, while the left ovary measures 2.6 x 1.8 x 1.8 cm. No suspicious adnexal masses are seen; there is no evidence for ovarian torsion. No free fluid is seen within the pelvic cul-de-sac. IMPRESSION: Single tiny intrauterine gestational sac suggested. The mean sac diameter of 3 mm corresponds to a gestational age of [redacted] weeks 6 days, which matches the gestational age of [redacted] weeks 0 days by LMP, reflecting an estimated date of delivery of October 27, 2017. No yolk sac or embryo is yet seen. Electronically Signed   By: Roanna Raider M.D.   On: 02/24/2017 21:56     MAU Course  Procedures None  MDM +UPT UA, wet prep, GC/chlamydia, CBC, quant hCG, HIV, RPR and [place] today to rule out ectopic pregnancy O negative - Rhogam work-up  Discussed with Dr. Mindi Slicker. Follow-up in the office on Monday for labs. Dr. Mindi Slicker called back. Patient has been dismissed from the practice. Should have follow-up labs in MAU Sunday to ensure appropriate follow-up, but patient can still call the office on Monday to see if she can be re-instated by the practice. Patient made aware of this situation. States that she moved and changed her phone number so she was unaware there was an issue. She will call Monday to discuss with the office.   Assessment and Plan  A: Pregnancy of unknown location Spotting in pregnancy Abdominal pain in pregnancy, first trimester   P: Discharge home Tylenol PRN for pain Ectopic/bleeding precautions discussed Patient advised to follow-up in MAU on Sunday evening for 48 hour repeat labs or sooner if her condition were to change or worsen   Marny Lowenstein, PA-C  02/24/2017, 10:01 PM

## 2017-02-25 LAB — RH IG WORKUP (INCLUDES ABO/RH)
ABO/RH(D): O NEG
Antibody Screen: NEGATIVE
FETAL SCREEN: NEGATIVE
GESTATIONAL AGE(WKS): 22.3
Unit division: 0

## 2017-02-25 LAB — HIV ANTIBODY (ROUTINE TESTING W REFLEX): HIV Screen 4th Generation wRfx: NONREACTIVE

## 2017-02-26 ENCOUNTER — Inpatient Hospital Stay (HOSPITAL_COMMUNITY)
Admission: AD | Admit: 2017-02-26 | Discharge: 2017-02-26 | Disposition: A | Discharge status: Home / Self Care | Payer: Self-pay | Source: Ambulatory Visit | Attending: Obstetrics and Gynecology | Admitting: Obstetrics and Gynecology

## 2017-02-26 ENCOUNTER — Encounter (HOSPITAL_COMMUNITY): Payer: Self-pay

## 2017-02-26 ENCOUNTER — Inpatient Hospital Stay (HOSPITAL_COMMUNITY): Payer: Self-pay

## 2017-02-26 DIAGNOSIS — O34219 Maternal care for unspecified type scar from previous cesarean delivery: Secondary | ICD-10-CM | POA: Insufficient documentation

## 2017-02-26 DIAGNOSIS — N83209 Unspecified ovarian cyst, unspecified side: Secondary | ICD-10-CM | POA: Insufficient documentation

## 2017-02-26 DIAGNOSIS — Z3A14 14 weeks gestation of pregnancy: Secondary | ICD-10-CM | POA: Insufficient documentation

## 2017-02-26 DIAGNOSIS — O26891 Other specified pregnancy related conditions, first trimester: Secondary | ICD-10-CM

## 2017-02-26 DIAGNOSIS — Z79899 Other long term (current) drug therapy: Secondary | ICD-10-CM | POA: Insufficient documentation

## 2017-02-26 DIAGNOSIS — Z91013 Allergy to seafood: Secondary | ICD-10-CM | POA: Insufficient documentation

## 2017-02-26 DIAGNOSIS — O209 Hemorrhage in early pregnancy, unspecified: Secondary | ICD-10-CM | POA: Insufficient documentation

## 2017-02-26 DIAGNOSIS — O3482 Maternal care for other abnormalities of pelvic organs, second trimester: Secondary | ICD-10-CM | POA: Insufficient documentation

## 2017-02-26 DIAGNOSIS — Z833 Family history of diabetes mellitus: Secondary | ICD-10-CM | POA: Insufficient documentation

## 2017-02-26 DIAGNOSIS — R109 Unspecified abdominal pain: Secondary | ICD-10-CM

## 2017-02-26 LAB — HCG, QUANTITATIVE, PREGNANCY: HCG, BETA CHAIN, QUANT, S: 587 m[IU]/mL — AB (ref <5)

## 2017-02-26 LAB — URINALYSIS, ROUTINE W REFLEX MICROSCOPIC
BILIRUBIN URINE: NEGATIVE
Glucose, UA: NEGATIVE mg/dL
Ketones, ur: NEGATIVE mg/dL
LEUKOCYTES UA: NEGATIVE
Nitrite: NEGATIVE
Protein, ur: NEGATIVE mg/dL
SPECIFIC GRAVITY, URINE: 1.013 (ref 1.005–1.030)
pH: 8 (ref 5.0–8.0)

## 2017-02-26 MED ORDER — OXYCODONE-ACETAMINOPHEN 5-325 MG PO TABS
1.0000 | ORAL_TABLET | Freq: Once | ORAL | Status: AC
  Administered 2017-02-26: 1 via ORAL
  Filled 2017-02-26: qty 1

## 2017-02-26 NOTE — Progress Notes (Addendum)
Pt back from past Friday for the same issue of pain in pelvic area that got worse according to pt. States smear of blood on toilet paper. Not wearing pad. VSS see flow sheet for details.   1510: oxyc given with water.  1511: lab at bs  1600: pt states medication helped with pain. 2-3/10  1618: U/S paged.   1621: pt to U/S via wheelchair  1650: pt back from U/S  1720: discharge instructions given with pt understanding. Pt left unit with SO via ambulatory

## 2017-02-26 NOTE — Discharge Instructions (Signed)
Vaginal Bleeding During Pregnancy, First Trimester °A small amount of bleeding (spotting) from the vagina is common in early pregnancy. Sometimes the bleeding is normal and is not a problem, and sometimes it is a sign of something serious. Be sure to tell your doctor about any bleeding from your vagina right away. °Follow these instructions at home: °· Watch your condition for any changes. °· Follow your doctor's instructions about how active you can be. °· If you are on bed rest: °¨ You may need to stay in bed and only get up to use the bathroom. °¨ You may be allowed to do some activities. °¨ If you need help, make plans for someone to help you. °· Write down: °¨ The number of pads you use each day. °¨ How often you change pads. °¨ How soaked (saturated) your pads are. °· Do not use tampons. °· Do not douche. °· Do not have sex or orgasms until your doctor says it is okay. °· If you pass any tissue from your vagina, save the tissue so you can show it to your doctor. °· Only take medicines as told by your doctor. °· Do not take aspirin because it can make you bleed. °· Keep all follow-up visits as told by your doctor. °Contact a doctor if: °· You bleed from your vagina. °· You have cramps. °· You have labor pains. °· You have a fever that does not go away after you take medicine. °Get help right away if: °· You have very bad cramps in your back or belly (abdomen). °· You pass large clots or tissue from your vagina. °· You bleed more. °· You feel light-headed or weak. °· You pass out (faint). °· You have chills. °· You are leaking fluid or have a gush of fluid from your vagina. °· You pass out while pooping (having a bowel movement). °This information is not intended to replace advice given to you by your health care provider. Make sure you discuss any questions you have with your health care provider. °Document Released: 03/31/2014 Document Revised: 04/21/2016 Document Reviewed: 07/22/2013 °Elsevier Interactive  Patient Education © 2017 [employer] ° °

## 2017-02-26 NOTE — MAU Provider Note (Signed)
History     CSN: [id number]  Arrival date and time: 02/26/17 1412   First Provider Initiated Contact with Patient 02/26/17 1454      Chief Complaint  Patient presents with  . Vaginal Bleeding  . Abdominal Cramping   HPI [name] is a 26 y.o. G2P1001 at [redacted]w[redacted]d who presents to MAU today with complaint of increased vaginal bleeding. The patient was seen in MAU 2 days ago with cramping and light pink spotting. She had a possible IUGS and hCG was 585. She states that abdominal pain is rated at 8/10 now. She stats bleeding is red now, but still very minimal. She has only noted with wiping and is not wearing a pad. She tried Tylenol last night with minimal relief, but didn't take anything today because she didn't want to take too much medicine.   OB History    Gravida Para Term Preterm AB Living   0 0 1   SAB TAB Ectopic Multiple Live Births   0 0 0 0 1      Past Medical History:  Diagnosis Date  . Blunt abdominal trauma 06/25/2012  . Chlamydia   . Normal pregnancy 06/25/2012  . Ovarian cyst   . S/P cesarean section 07/01/2012    Past Surgical History:  Procedure Laterality Date  . CESAREAN SECTION  07/01/2012   Procedure: CESAREAN SECTION;  Surgeon: Sherron Monday, MD;  Location: [facility];  Service: Gynecology;  Laterality: N/A;  Primary cesarean section with delivery of baby boy at 58.  Marland Kitchen NO PAST SURGERIES      Family History  Problem Relation Age of Onset  . Diabetes Paternal Grandmother   . Anesthesia problems Neg Hx     Social History  Substance Use Topics  . Smoking status: Never Smoker  . Smokeless tobacco: Never Used  . Alcohol use No    Allergies:  Allergies  Allergen Reactions  . Shellfish Allergy Itching    Pt states she is only allergic to "crab legs" can eat and use all other shell fish items    Prescriptions Prior to Admission  Medication Sig Dispense Refill Last Dose  . acetaminophen (TYLENOL) 500 MG tablet Take 1,000 mg by mouth every 4  (four) hours as needed for mild pain or moderate pain. For leg pain    02/25/2017 at Unknown time  . diphenhydrAMINE (BENADRYL) 25 mg capsule Take 50 mg by mouth every 6 (six) hours as needed for itching or sleep.   02/24/2017 at Unknown time  . Melatonin 3 MG TABS Take 4 tablets by mouth at bedtime as needed.   02/23/2017 at Unknown time  . Prenatal Vit-Fe Fumarate-FA (PRENATAL MULTIVITAMIN) TABS Take 1 tablet by mouth every morning. 30 tablet 12 02/26/2017 at Unknown time  . calcium carbonate (TUMS EX) 750 MG chewable tablet Chew 2 tablets by mouth as needed. Used for heartburn.   prn    Review of Systems  Constitutional: Negative for fever.  Gastrointestinal: Positive for abdominal pain.  Genitourinary: Positive for vaginal bleeding. Negative for dysuria, frequency, urgency and vaginal discharge.   Physical Exam   Blood pressure 133/81, pulse 95, temperature 98.5 F (36.9 C), temperature source Oral, resp. rate 18, height 5' (1.524 m), weight 147 lb (66.7 kg), last menstrual period 01/20/2017, unknown if currently breastfeeding.  Physical Exam  Nursing note and vitals reviewed. Constitutional: She is oriented to person, place, and time. She appears well-developed and well-nourished. No distress.  HENT:  Head:  Normocephalic and atraumatic.  Cardiovascular: Normal rate.   Respiratory: Effort normal.  GI: Soft. She exhibits no distension and no mass. There is no tenderness. There is no rebound and no guarding.  Neurological: She is alert and oriented to person, place, and time.  Skin: Skin is warm and dry. No erythema.  Psychiatric: She has a normal mood and affect.    Results for orders placed or performed during the hospital encounter of 02/26/17 (from the past 24 hour(s))  Urinalysis, Routine w reflex microscopic     Status: Abnormal   Collection Time: 02/26/17  2:25 PM  Result Value Ref Range   Color, Urine YELLOW YELLOW   APPearance CLEAR CLEAR   Specific Gravity, Urine 1.013  1.005 - 1.030   pH 8.0 5.0 - 8.0   Glucose, UA NEGATIVE NEGATIVE mg/dL   Hgb urine dipstick MODERATE (A) NEGATIVE   Bilirubin Urine NEGATIVE NEGATIVE   Ketones, ur NEGATIVE NEGATIVE mg/dL   Protein, ur NEGATIVE NEGATIVE mg/dL   Nitrite NEGATIVE NEGATIVE   Leukocytes, UA NEGATIVE NEGATIVE   RBC / HPF 0-5 0 - 5 RBC/hpf   WBC, UA 0-5 0 - 5 WBC/hpf   Bacteria, UA RARE (A) NONE SEEN   Squamous Epithelial / LPF 6-30 (A) NONE SEEN   Mucous PRESENT   hCG, quantitative, pregnancy     Status: Abnormal   Collection Time: 02/26/17  3:22 PM  Result Value Ref Range   hCG, Beta Chain, Quant, S 587 (H) <5 mIU/mL   Results for Katherine, Robbins (MRN [id number]) as of 02/26/2017 17:01  Ref. Range 02/24/2017 20:08 02/24/2017 21:00 02/24/2017 21:43 02/26/2017 14:25 02/26/2017 15:22  HCG, Beta Chain, Quant, S Latest Ref Range: <5 mIU/mL 585 (H)    587 (H)   US Ob Transvaginal  Result Date: 02/26/2017 CLINICAL DATA:  Pelvic pain EXAM: OBSTETRIC <14 WK [place] AND TRANSVAGINAL OB US TECHNIQUE: Both transabdominal and transvaginal ultrasound examinations were performed for complete evaluation of the gestation as well as the maternal uterus, adnexal regions, and pelvic cul-de-sac. Transvaginal technique was performed to assess early pregnancy. COMPARISON:  02/24/2017. FINDINGS: Intrauterine gestational sac: Single Yolk sac:  No Embryo:  No Cardiac Activity: No Heart Rate: Not applicable  bpm MSD: 3  mm   5 w   0  d Subchorionic hemorrhage:  None visualized. Maternal uterus/adnexae: Right ovary: Normal Left ovary: Normal Other :None Free fluid:  None IMPRESSION: 1. Probable early intrauterine gestational sac, but no yolk sac, fetal pole, or cardiac activity yet visualized. Recommend follow-up quantitative B-HCG levels and follow-up US in 14 days to assess viability. This recommendation follows SRU consensus guidelines: Diagnostic Criteria for Nonviable Pregnancy Early in the First Trimester. Malva Limes Med 2013; 409:8119-14.  Electronically Signed   By: Signa Kell M.D.   On: 02/26/2017 16:55    MAU Course  Procedures None  MDM UA today Repeat hCG drawn.  1 Percocet given for pain. Patient reports significant improvement in pain.  Discussed patient and results with Dr. Emelda Fear. He has reviewed the images from [place] and feels that this is more likely a failed pregnancy and would treat it as such. MTX is not warranted today.  Discussed this with the patient. Reviewed options of expectant management vs Cytotec. Patient would like to wait before taking medication. Offered to have labs drawn in [facility] in 48 hours. Patient would like to wait and have labs drawn again.  Assessment and Plan  A: Likely failed pregnancy Vaginal bleeding in  pregnancy, first trimester  P: Discharge home Bleeding precautions discussed Patient advised to follow-up with [facility] at 11:00 am on Tuesday for repeat labs Patient may return to MAU as needed or if her condition were to change or worsen   Marny Lowenstein, PA-C  02/26/2017, 5:19 PM

## 2017-02-26 NOTE — MAU Note (Signed)
Pt to come in today for repeat HCG level, reports increased pain and bleeding

## 2017-02-27 LAB — GC/CHLAMYDIA PROBE AMP (~~LOC~~) NOT AT ARMC
Chlamydia: NEGATIVE
NEISSERIA GONORRHEA: NEGATIVE

## 2017-02-28 ENCOUNTER — Telehealth: Payer: Self-pay | Admitting: General Practice

## 2017-02-28 ENCOUNTER — Ambulatory Visit: Payer: Self-pay

## 2017-02-28 NOTE — Telephone Encounter (Signed)
Patient no showed for stat bhcg today. Called patient and she states she already [facility]. Patient had no questions

## 2017-03-13 ENCOUNTER — Encounter (HOSPITAL_COMMUNITY): Payer: Self-pay

## 2017-03-13 NOTE — H&P (Signed)
Katherine Robbins is an 26 y.o. female with [facility] of [phone]. U/S 02/26/17 showed empty IU gestational sac. U/S in office 03/07/17 showed empty IU sac. D/W patient MAB and options reviewed. Patient given misoprostol 800 mcg per vagina 03/07/17. She had BM shortly after placing IV dose and noticed at least one of tablets in toilet. Now no bleeding or cramping. Options of repeat misoprostol, expectant management or D&E reviewed. Patient elects D&E.  Pertinent Gynecological History: Menses: N/A Bleeding: N/A Contraception: N/A DES exposure: denies Blood transfusions: none Sexually transmitted diseases: no past history Previous GYN Procedures: none  Last mammogram: N/A Date: N/A Last pap: unknown Date: N/A OB History: G2, P1   Menstrual History: Menarche age: unknown Patient's last menstrual period was 01/20/2017.    Past Medical History:  Diagnosis Date  . Blunt abdominal trauma 06/25/2012  . Chlamydia   . Normal pregnancy 06/25/2012  . Ovarian cyst   . S/P cesarean section 07/01/2012    Past Surgical History:  Procedure Laterality Date  . CESAREAN SECTION  07/01/2012   Procedure: CESAREAN SECTION;  Surgeon: Sherron Monday, MD;  Location: [facility];  Service: Gynecology;  Laterality: N/A;  Primary cesarean section with delivery of baby boy at 25.  Marland Kitchen NO PAST SURGERIES      Family History  Problem Relation Age of Onset  . Diabetes Paternal Grandmother   . Anesthesia problems Neg Hx     Social History:  reports that she has never smoked. She has never used smokeless tobacco. She reports that she does not drink alcohol or use drugs.  Allergies:  Allergies  Allergen Reactions  . Apple Other (See Comments)    Throat and ears itch  . Shellfish Allergy Itching    Pt states she is only allergic to "crab legs" can eat and use all other shell fish items    No prescriptions prior to admission.    Review of Systems  Constitutional: Negative for fever.    Last menstrual period  01/20/2017, unknown if currently breastfeeding. Physical Exam  Cardiovascular: Normal rate and regular rhythm.   Respiratory: Effort normal and breath sounds normal.  GI: Soft.    No results found for this or any previous visit (from the past 24 hour(s)).  No results found.   O neg, Rhogam given 02/24/17  Assessment/Plan: 26 yo G2P1 with MAB and failed IV misoprostol  Patient elects D&E  Melrose Kearse II,Morad Tal E 03/13/2017, 6:07 PM

## 2017-03-14 ENCOUNTER — Ambulatory Visit (HOSPITAL_COMMUNITY): Payer: BLUE CROSS/BLUE SHIELD | Admitting: Certified Registered Nurse Anesthetist

## 2017-03-14 ENCOUNTER — Ambulatory Visit (HOSPITAL_COMMUNITY)
Admission: AD | Admit: 2017-03-14 | Discharge: 2017-03-14 | Disposition: A | Discharge status: Home / Self Care | Payer: BLUE CROSS/BLUE SHIELD | Source: Ambulatory Visit | Attending: Obstetrics and Gynecology | Admitting: Obstetrics and Gynecology

## 2017-03-14 ENCOUNTER — Encounter (HOSPITAL_COMMUNITY): Payer: Self-pay | Admitting: *Deleted

## 2017-03-14 ENCOUNTER — Encounter (HOSPITAL_COMMUNITY)
Admission: AD | Disposition: A | Discharge status: Home / Self Care | Payer: Self-pay | Source: Ambulatory Visit | Attending: Obstetrics and Gynecology

## 2017-03-14 DIAGNOSIS — O021 Missed abortion: Secondary | ICD-10-CM | POA: Diagnosis not present

## 2017-03-14 DIAGNOSIS — N83209 Unspecified ovarian cyst, unspecified side: Secondary | ICD-10-CM | POA: Diagnosis not present

## 2017-03-14 HISTORY — PX: DILATION AND EVACUATION: SHX1459

## 2017-03-14 LAB — CBC
HCT: 39.1 % (ref 36.0–46.0)
Hemoglobin: 13.1 g/dL (ref 12.0–15.0)
MCH: 30.9 pg (ref 26.0–34.0)
MCHC: 33.5 g/dL (ref 30.0–36.0)
MCV: 92.2 fL (ref 78.0–100.0)
Platelets: 307 10*3/uL (ref 150–400)
RBC: 4.24 MIL/uL (ref 3.87–5.11)
RDW: 12.6 % (ref 11.5–15.5)
WBC: 8.1 10*3/uL (ref 4.0–10.5)

## 2017-03-14 SURGERY — DILATION AND EVACUATION, UTERUS
Anesthesia: Monitor Anesthesia Care | Site: Uterus

## 2017-03-14 MED ORDER — LACTATED RINGERS IV SOLN
INTRAVENOUS | Status: DC
  Administered 2017-03-14: 08:00:00 via INTRAVENOUS

## 2017-03-14 MED ORDER — DEXAMETHASONE SODIUM PHOSPHATE 10 MG/ML IJ SOLN
INTRAMUSCULAR | Status: DC | PRN
  Administered 2017-03-14: 10 mg via INTRAVENOUS

## 2017-03-14 MED ORDER — PROPOFOL 10 MG/ML IV BOLUS
INTRAVENOUS | Status: AC
  Filled 2017-03-14: qty 20

## 2017-03-14 MED ORDER — LIDOCAINE HCL (CARDIAC) 20 MG/ML IV SOLN
INTRAVENOUS | Status: DC | PRN
  Administered 2017-03-14: 80 mg via INTRAVENOUS

## 2017-03-14 MED ORDER — CEFAZOLIN SODIUM-DEXTROSE 2-4 GM/100ML-% IV SOLN
2.0000 g | INTRAVENOUS | Status: AC
  Administered 2017-03-14: 2 g via INTRAVENOUS

## 2017-03-14 MED ORDER — PROPOFOL 500 MG/50ML IV EMUL
INTRAVENOUS | Status: DC | PRN
  Administered 2017-03-14: 25 ug/kg/min via INTRAVENOUS
  Administered 2017-03-14: 50 ug/kg/min via INTRAVENOUS

## 2017-03-14 MED ORDER — MIDAZOLAM HCL 2 MG/2ML IJ SOLN
INTRAMUSCULAR | Status: DC | PRN
  Administered 2017-03-14: 2 mg via INTRAVENOUS

## 2017-03-14 MED ORDER — SCOPOLAMINE 1 MG/3DAYS TD PT72
1.0000 | MEDICATED_PATCH | TRANSDERMAL | Status: DC
  Administered 2017-03-14: 1.5 mg via TRANSDERMAL

## 2017-03-14 MED ORDER — FENTANYL CITRATE (PF) 100 MCG/2ML IJ SOLN
INTRAMUSCULAR | Status: AC
  Filled 2017-03-14: qty 2

## 2017-03-14 MED ORDER — OXYCODONE-ACETAMINOPHEN 5-325 MG PO TABS: 1.0000 | ORAL_TABLET | Freq: Four times a day (QID) | ORAL | 0 refills | Status: DC | PRN

## 2017-03-14 MED ORDER — FENTANYL CITRATE (PF) 100 MCG/2ML IJ SOLN: 25.0000 ug | INTRAMUSCULAR | Status: DC | PRN

## 2017-03-14 MED ORDER — DEXAMETHASONE SODIUM PHOSPHATE 10 MG/ML IJ SOLN
INTRAMUSCULAR | Status: AC
  Filled 2017-03-14: qty 1

## 2017-03-14 MED ORDER — ONDANSETRON HCL 4 MG/2ML IJ SOLN
INTRAMUSCULAR | Status: DC | PRN
  Administered 2017-03-14: 4 mg via INTRAVENOUS

## 2017-03-14 MED ORDER — LIDOCAINE HCL 1 % IJ SOLN
INTRAMUSCULAR | Status: AC
  Filled 2017-03-14: qty 20

## 2017-03-14 MED ORDER — PROMETHAZINE HCL 25 MG/ML IJ SOLN: 6.2500 mg | INTRAMUSCULAR | Status: DC | PRN

## 2017-03-14 MED ORDER — PROPOFOL 500 MG/50ML IV EMUL
INTRAVENOUS | Status: DC | PRN
  Administered 2017-03-14 (×2): 50 mg via INTRAVENOUS

## 2017-03-14 MED ORDER — ONDANSETRON HCL 4 MG/2ML IJ SOLN
INTRAMUSCULAR | Status: AC
  Filled 2017-03-14: qty 2

## 2017-03-14 MED ORDER — LACTATED RINGERS IV SOLN: INTRAVENOUS | Status: DC

## 2017-03-14 MED ORDER — MIDAZOLAM HCL 2 MG/2ML IJ SOLN
INTRAMUSCULAR | Status: AC
  Filled 2017-03-14: qty 2

## 2017-03-14 MED ORDER — RHO D IMMUNE GLOBULIN 1500 UNIT/2ML IJ SOSY
300.0000 ug | PREFILLED_SYRINGE | Freq: Once | INTRAMUSCULAR | Status: AC
  Administered 2017-03-14: 300 ug via INTRAMUSCULAR
  Filled 2017-03-14: qty 2

## 2017-03-14 MED ORDER — KETOROLAC TROMETHAMINE 30 MG/ML IJ SOLN
INTRAMUSCULAR | Status: DC | PRN
  Administered 2017-03-14: 30 mg via INTRAVENOUS

## 2017-03-14 MED ORDER — FENTANYL CITRATE (PF) 100 MCG/2ML IJ SOLN
INTRAMUSCULAR | Status: DC | PRN
  Administered 2017-03-14: 100 ug via INTRAVENOUS

## 2017-03-14 MED ORDER — SCOPOLAMINE 1 MG/3DAYS TD PT72
MEDICATED_PATCH | TRANSDERMAL | Status: AC
  Administered 2017-03-14: 1.5 mg via TRANSDERMAL
  Filled 2017-03-14: qty 1

## 2017-03-14 MED ORDER — LIDOCAINE HCL 1 % IJ SOLN
INTRAMUSCULAR | Status: DC | PRN
  Administered 2017-03-14: 10 mL

## 2017-03-14 MED ORDER — LIDOCAINE HCL (CARDIAC) 20 MG/ML IV SOLN
INTRAVENOUS | Status: AC
  Filled 2017-03-14: qty 5

## 2017-03-14 MED ORDER — KETOROLAC TROMETHAMINE 30 MG/ML IJ SOLN
INTRAMUSCULAR | Status: AC
  Filled 2017-03-14: qty 1

## 2017-03-14 SURGICAL SUPPLY — 21 items
CATH ROBINSON RED A/P 16FR (CATHETERS) ×3 IMPLANT
CLOTH BEACON ORANGE TIMEOUT ST (SAFETY) ×3 IMPLANT
DECANTER SPIKE VIAL GLASS SM (MISCELLANEOUS) ×3 IMPLANT
GLOVE BIO SURGEON STRL SZ8 (GLOVE) ×6 IMPLANT
GLOVE BIOGEL PI IND STRL 7.0 (GLOVE) ×1 IMPLANT
GLOVE BIOGEL PI IND STRL 8 (GLOVE) ×1 IMPLANT
GLOVE BIOGEL PI INDICATOR 7.0 (GLOVE) ×2
GLOVE BIOGEL PI INDICATOR 8 (GLOVE) ×2
GOWN STRL REUS W/TWL LRG LVL3 (GOWN DISPOSABLE) ×6 IMPLANT
KIT BERKELEY 1ST TRIMESTER 3/8 (MISCELLANEOUS) ×3 IMPLANT
NS IRRIG 1000ML POUR BTL (IV SOLUTION) ×3 IMPLANT
PACK VAGINAL MINOR WOMEN LF (CUSTOM PROCEDURE TRAY) ×3 IMPLANT
PAD OB MATERNITY 4.3X12.25 (PERSONAL CARE ITEMS) ×3 IMPLANT
PAD PREP 24X48 CUFFED NSTRL (MISCELLANEOUS) ×3 IMPLANT
SET BERKELEY SUCTION TUBING (SUCTIONS) ×3 IMPLANT
TOWEL OR 17X24 6PK STRL BLUE (TOWEL DISPOSABLE) ×6 IMPLANT
VACURETTE 10 RIGID CVD (CANNULA) IMPLANT
VACURETTE 6 ASPIR F TIP BERK (CANNULA) ×3 IMPLANT
VACURETTE 7MM CVD STRL WRAP (CANNULA) ×3 IMPLANT
VACURETTE 8 RIGID CVD (CANNULA) IMPLANT
VACURETTE 9 RIGID CVD (CANNULA) IMPLANT

## 2017-03-14 NOTE — Brief Op Note (Signed)
03/14/2017  8:46 AM  PATIENT:  Katherine Robbins  26 y.o. female  PRE-OPERATIVE DIAGNOSIS:  MISSED AB  POST-OPERATIVE DIAGNOSIS:  MISSED ABORTION  PROCEDURE:  Procedure(s): DILATATION AND EVACUATION (N/A)  SURGEON:  Surgeon(s) and Role:    * Harold Hedge, MD - Primary  PHYSICIAN ASSISTANT:   ASSISTANTS: none   ANESTHESIA:   IV sedation  EBL:  Total I/O In: -  Out: 30 [Urine:30]  BLOOD ADMINISTERED:none  DRAINS: none   LOCAL MEDICATIONS USED:  LIDOCAINE  and Amount: 20 ml  SPECIMEN:  Source of Specimen:  uterine currettings  DISPOSITION OF SPECIMEN:  PATHOLOGY  COUNTS:  YES  TOURNIQUET:  * No tourniquets in log *  DICTATION: .Other Dictation: Dictation Number [id number]  PLAN OF CARE: Discharge to home after PACU  PATIENT DISPOSITION:  PACU - hemodynamically stable.   Delay start of Pharmacological VTE agent (>24hrs) due to surgical blood loss or risk of bleeding: not applicable

## 2017-03-14 NOTE — Progress Notes (Signed)
Reviewed with patient clinical course C/W nonviable pregnancy. D/W D&E, post operative instructions and risks including infection, organ damage and uterine perforation, bleeding/transfusion-HIV/Hep, DVT/PE, pneumonia, IU synechiae and secondary infertility. Patient states she understands and agrees Consent signed on chart.

## 2017-03-14 NOTE — Transfer of Care (Signed)
Immediate Anesthesia Transfer of Care Note  Patient: Theresa Mulligan  Procedure(s) Performed: Procedure(s): DILATATION AND EVACUATION (N/A)  Patient Location: PACU  Anesthesia Type:MAC  Level of Consciousness: awake, alert  and oriented  Airway & Oxygen Therapy: Patient Spontanous Breathing and Patient connected to nasal cannula oxygen  Post-op Assessment: Report given to RN, Post -op Vital signs reviewed and stable and Patient moving all extremities  Post vital signs: Reviewed and stable  Last Vitals:  Vitals:   03/14/17 0750  BP: 111/80  Pulse: 75  Resp: 18  Temp: 36.7 C    Last Pain:  Vitals:   03/14/17 0750  TempSrc: Oral  PainSc: 2       Patients Stated Pain Goal: 4 (67/[id number])  Complications: No apparent anesthesia complications

## 2017-03-14 NOTE — Discharge Instructions (Signed)
Care After These instructions give you information about caring for yourself after your procedure. Your doctor may also give you more specific instructions. Call your doctor if you have any problems or questions after your procedure. Follow these instructions at home: Activity   Do not drive or use heavy machinery while taking prescription pain medicine.  For 24 hours after your procedure, avoid driving.  Take short walks often, followed by rest periods. Ask your doctor what activities are safe for you. After one or two days, you may be able to return to your normal activities.  Do not lift anything that is heavier than 10 lb (4.5 kg) until your doctor approves.  For at least 2 weeks, or as long as told by your doctor:  Do not douche.  Do not use tampons.  Do not have sex. General instructions   Take over-the-counter and prescription medicines only as told by your doctor. This is very important if you take blood thinning medicine.  Do not take baths, swim, or use a hot tub until your doctor approves. Take showers instead of baths.  Wear compression stockings as told by your doctor.  It is up to you to get the results of your procedure. Ask your doctor when your results will be ready.  Keep all follow-up visits as told by your doctor. This is important. Contact a doctor if:  You have very bad cramps that get worse or do not get better with medicine.  You have very bad pain in your belly (abdomen).  You cannot drink fluids without throwing up (vomiting).  You get pain in a different part of the area between your belly and thighs (pelvis).  You have bad-smelling discharge from your vagina.  You have a rash. Get help right away if:  You are bleeding a lot from your vagina. A lot of bleeding means soaking more than one sanitary pad in an hour, for 2 hours in a row.  You have clumps of blood (blood clots) coming from your vagina.  You have a fever or chills.  Your belly  feels very tender or hard.  You have chest pain.  You have trouble breathing.  You cough up blood.  You feel dizzy.  You feel light-headed.  You pass out (faint).  You have pain in your neck or shoulder area. Summary  Take short walks often, followed by rest periods. Ask your doctor what activities are safe for you. After one or two days, you may be able to return to your normal activities.  Do not lift anything that is heavier than 10 lb (4.5 kg) until your doctor approves.  Do not take baths, swim, or use a hot tub until your doctor approves. Take showers instead of baths.  Contact your doctor if you have any symptoms of infection, like bad-smelling discharge from your vagina. This information is not intended to replace advice given to you by your health care provider. Make sure you discuss any questions you have with your health care provider. Document Released: 08/23/2008 Document Revised: 08/01/2016 Document Reviewed: 08/01/2016 Elsevier Interactive Patient Education  2017 [employer].

## 2017-03-14 NOTE — Anesthesia Preprocedure Evaluation (Addendum)
Anesthesia Evaluation  Patient identified by MRN, date of birth, ID band Patient awake    Reviewed: Allergy & Precautions, NPO status , Patient's Chart, lab work & pertinent test results  Airway Mallampati: II  TM Distance: >3 FB Neck ROM: Full    Dental  (+) Teeth Intact, Dental Advisory Given, Chipped,    Pulmonary neg pulmonary ROS,    Pulmonary exam normal breath sounds clear to auscultation       Cardiovascular Exercise Tolerance: Good negative cardio ROS Normal cardiovascular exam Rhythm:Regular Rate:Normal     Neuro/Psych negative neurological ROS  negative psych ROS   GI/Hepatic negative GI ROS, Neg liver ROS,   Endo/Other  negative endocrine ROS  Renal/GU negative Renal ROS     Musculoskeletal negative musculoskeletal ROS (+)   Abdominal   Peds  Hematology negative hematology ROS (+)   Anesthesia Other Findings Day of surgery medications reviewed with the patient.  Reproductive/Obstetrics Missed abortion                            Anesthesia Physical Anesthesia Plan  ASA: II  Anesthesia Plan: MAC   Post-op Pain Management:    Induction: Intravenous  Airway Management Planned: Nasal Cannula  Additional Equipment:   Intra-op Plan:   Post-operative Plan:   Informed Consent: I have reviewed the patients History and Physical, chart, labs and discussed the procedure including the risks, benefits and alternatives for the proposed anesthesia with the patient or authorized representative who has indicated his/her understanding and acceptance.   Dental advisory given  Plan Discussed with: CRNA and Anesthesiologist  Anesthesia Plan Comments: (Discussed risks/benefits/alternatives to MAC sedation including need for ventilatory support, hypotension, need for conversion to general anesthesia.  All patient questions answered.  Patient/guardian wishes to proceed.)         Anesthesia Quick Evaluation

## 2017-03-14 NOTE — Anesthesia Postprocedure Evaluation (Signed)
Anesthesia Post Note  Patient: Katherine Robbins  Procedure(s) Performed: Procedure(s) (LRB): DILATATION AND EVACUATION (N/A)  Patient location during evaluation: PACU Anesthesia Type: MAC Level of consciousness: awake and alert Pain management: pain level controlled Vital Signs Assessment: post-procedure vital signs reviewed and stable Respiratory status: spontaneous breathing and respiratory function stable Cardiovascular status: stable Anesthetic complications: no        Last Vitals:  Vitals:   03/14/17 0930 03/14/17 1024  BP:  (!) 106/54  Pulse: 81 76  Resp: (!) 22 18  Temp:  36.7 C    Last Pain:  Vitals:   03/14/17 0750  TempSrc: Oral  PainSc: 2    Pain Goal: Patients Stated Pain Goal: 4 (03/14/17 0750)               Bradley

## 2017-03-15 ENCOUNTER — Encounter (HOSPITAL_COMMUNITY): Payer: Self-pay | Admitting: Obstetrics and Gynecology

## 2017-03-15 LAB — RH IG WORKUP (INCLUDES ABO/RH)
ABO/RH(D): O NEG
Gestational Age(Wks): 7
UNIT DIVISION: 0

## 2017-03-15 NOTE — OR Nursing (Signed)
03/15/17 11:42 pt states she has some slight numbness on the tip of her tongue. States she had it the day of her procedure also.  No c/o swallowing or swelling. No difficulty breathing.  Instructed to call us back if it doesn't resolve in a day or so or worsens. Margarita Mail rn

## 2017-03-15 NOTE — Op Note (Signed)
NAME:  Katherine Robbins, COPPESS                   ACCOUNT NO.:  MEDICAL RECORD NO.:  [id number]  LOCATION:                                 FACILITY:  PHYSICIAN:  Guy Sandifer. Henderson Cloud, M.D. DATE OF BIRTH:  Apr 23, 1991  DATE OF PROCEDURE:  03/14/2017 DATE OF DISCHARGE:                              OPERATIVE REPORT   PREOPERATIVE DIAGNOSIS:  Missed abortion.  POSTOPERATIVE DIAGNOSIS:  Missed abortion.  PROCEDURE:  Dilation and evacuation.  SURGEON:  Guy Sandifer. Henderson Cloud, M.D.  ANESTHESIA:  IV sedation.  ANESTHESIOLOGIST:  Dr. Desmond Lope.  SPECIMENS:  Uterine curettings to Pathology.  ESTIMATED BLOOD LOSS:  Less than 50 mL.  INDICATIONS AND CONSENT:  This patient is a 26 year old G2, P1, who has had an abnormal rise and then a dip in her quantitative HCG levels. Ultrasound x2 reveals an isolated intrauterine gestational sac with no adnexal masses.  After discussion of options, the patient elected for misoprostol.  After 1 week, she has had no results from that.  After further discussion of options, she elected dilation and evacuation.  The procedure, postoperative instructions, and risks were discussed preoperatively.  Potential risks and complications were discussed preoperatively including, but not limited to, infection, uterine perforation, organ damage, bleeding requiring transfusion of blood products with HIV and hepatitis acquisition, DVT, PE, pneumonia, laparoscopy, laparotomy, intrauterine synechiae, and secondary infertility.  The patient states she understands and agrees.  Consent was signed on the chart.  DESCRIPTION OF PROCEDURE:  The patient was taken to the operating room where she is identified, placed in dorsal supine position, and underwent IV sedation.  She was placed in the dorsal lithotomy position.  Time-out was undertaken.  She was prepped with Hibiclens, bladder straight catheterized, and draped in a sterile fashion.  A bivalve speculum was placed.  Anterior cervical lip  was injected with 1% lidocaine and grasped with single-tooth tenaculum.  Paracervical block was placed at 2, 4, 5, 7, 8, and 10 o'clock positions with approximately 20 mL of the same solution.  Cervix was gently progressively dilated.  A #6 flexible curette was initially passed and suction curettage was carried out for a small amount of tissue.  This was followed by sharp curettage and then suction curettage with a #7 curved curette.  Cavity was clean.  Good hemostasis was noted.  Instruments were removed.  The patient was awakened and taken to recovery room in stable condition.     Guy Sandifer Henderson Cloud, M.D.     JET/MEDQ  D:  03/14/2017  T:  03/15/2017  Job:  [id number]

## 2017-03-18 LAB — TYPE AND SCREEN
ABO/RH(D): O NEG
Antibody Screen: POSITIVE
DAT, IGG: NEGATIVE
Unit division: 0
Unit division: 0

## 2017-03-18 LAB — BPAM RBC
Blood Product Expiration Date: 201805072359
Blood Product Expiration Date: 201805082359
Unit Type and Rh: 9500
Unit Type and Rh: 9500

## 2017-08-11 ENCOUNTER — Encounter (HOSPITAL_COMMUNITY): Payer: Self-pay

## 2017-08-11 ENCOUNTER — Emergency Department (HOSPITAL_COMMUNITY)
Admission: EM | Admit: 2017-08-11 | Discharge: 2017-08-11 | Disposition: A | Discharge status: Home / Self Care | Payer: Self-pay | Attending: Emergency Medicine | Admitting: Emergency Medicine

## 2017-08-11 ENCOUNTER — Emergency Department (HOSPITAL_COMMUNITY): Payer: Self-pay

## 2017-08-11 DIAGNOSIS — M25572 Pain in left ankle and joints of left foot: Secondary | ICD-10-CM | POA: Insufficient documentation

## 2017-08-11 DIAGNOSIS — Z79899 Other long term (current) drug therapy: Secondary | ICD-10-CM | POA: Insufficient documentation

## 2017-08-11 MED ORDER — NAPROXEN 500 MG PO TABS: 500.0000 mg | ORAL_TABLET | Freq: Two times a day (BID) | ORAL | 0 refills | Status: DC

## 2017-08-11 MED ORDER — HYDROCODONE-ACETAMINOPHEN 5-325 MG PO TABS
1.0000 | ORAL_TABLET | Freq: Once | ORAL | Status: AC
  Administered 2017-08-11: 1 via ORAL
  Filled 2017-08-11: qty 1

## 2017-08-11 NOTE — ED Provider Notes (Signed)
[facility]-EMERGENCY DEPT Provider Note   CSN: [id number] Arrival date & time: 08/11/17  2010     History   Chief Complaint Chief Complaint  Patient presents with  . Ankle Pain    L    HPI Katherine Robbins is a 26 y.o. female who presents emergent department today for left ankle pain 1 day. Patient states earlier today she was at work and walking back to her desk when she noticed a sharp, throbbing pain on the lateral aspect of her ankle. She can't remember if she fell or twisted this. She states she is "clumsy" at times. The patient has was difficult to walk on the foot after the event. The patient has not taken anything for this. No prior surgeries or injury. Denies IVDU, fever, chills, knee pain, calf pain, erythema, heat, numbness or tingling. Patient is on estrogen birth control but denies, recent surgery or travel, trauma, immobilization, smoking, previous blood clot, cough, hemoptysis, or cancer. No other complaints at this time.   HPI  Past Medical History:  Diagnosis Date  . Blunt abdominal trauma 06/25/2012  . Chlamydia   . Normal pregnancy 06/25/2012  . Ovarian cyst   . S/P cesarean section 07/01/2012    Patient Active Problem List   Diagnosis Date Noted  . S/P cesarean section 07/01/2012  . Normal pregnancy 06/25/2012    Past Surgical History:  Procedure Laterality Date  . CESAREAN SECTION  07/01/2012   Procedure: CESAREAN SECTION;  Surgeon: Sherron Monday, MD;  Location: [facility] ORS;  Service: Gynecology;  Laterality: N/A;  Primary cesarean section with delivery of baby boy at 42.  Marland Kitchen DILATION AND EVACUATION N/A 03/14/2017   Procedure: DILATATION AND EVACUATION;  Surgeon: Harold Hedge, MD;  Location: [facility] ORS;  Service: Gynecology;  Laterality: N/A;    OB History    Gravida Para Term Preterm AB Living   0 0 1   SAB TAB Ectopic Multiple Live Births   0 0 0 0 1       Home Medications    Prior to Admission medications   Medication Sig Start Date End Date Taking?  Authorizing Provider  acetaminophen (TYLENOL) 500 MG tablet Take 1,000 mg by mouth daily as needed for mild pain or headache. For leg pain     [provider]  calcium carbonate (TUMS EX) 750 MG chewable tablet Chew 2 tablets by mouth every 8 (eight) hours as needed for heartburn.     [provider]  naproxen (NAPROSYN) 500 MG tablet Take 1 tablet (500 mg total) by mouth 2 (two) times daily. 08/11/17   Bostyn Kunkler, Elmer Sow, PA-C  oxyCODONE-acetaminophen (ROXICET) 5-325 MG tablet Take 1-2 tablets by mouth every 6 (six) hours as needed for severe pain. 03/14/17   Harold Hedge, MD  Prenatal Vit-Fe Fumarate-FA (PRENATAL MULTIVITAMIN) TABS Take 1 tablet by mouth every morning. 07/04/12   Bovard-StuckertAugusto Gamble, MD    Family History Family History  Problem Relation Age of Onset  . Diabetes Paternal Grandmother   . Anesthesia problems Neg Hx     Social History Social History  Substance Use Topics  . Smoking status: Never Smoker  . Smokeless tobacco: Never Used  . Alcohol use No     Allergies   Apple and Shellfish allergy   Review of Systems Review of Systems  Constitutional: Negative for chills and fever.  Respiratory: Negative for cough and shortness of breath.   Cardiovascular: Negative for chest pain.  Musculoskeletal: Positive for arthralgias  and gait problem.  Skin: Negative for color change and wound.  Neurological: Negative for weakness and numbness.  All other systems reviewed and are negative.    Physical Exam Updated Vital Signs BP (!) 144/90   Pulse 96   Temp 98.4 F (36.9 C) (Oral)   Resp 16   LMP 01/20/2017   SpO2 100%   Physical Exam  Constitutional: She appears well-developed and well-nourished.  HENT:  Head: Normocephalic and atraumatic.  Right Ear: External ear normal.  Left Ear: External ear normal.  Eyes: Conjunctivae are normal. Right eye exhibits no discharge. Left eye exhibits no discharge. No scleral icterus.  Cardiovascular:    Pulses:      Dorsalis pedis pulses are 2+ on the right side, and 2+ on the left side.       Posterior tibial pulses are 2+ on the right side, and 2+ on the left side.  Pulmonary/Chest: Effort normal. No respiratory distress.  Musculoskeletal:       Left knee: Normal.       Right ankle: Normal. Achilles tendon normal.  Left ankle: Appearance normal. No obvious deformity. No skin erythema, heat, fluctuance or break of the skin. TTP over lateral malleolus with mild swelling associated. No other TTP of the foot, ankle or calf. No achilles tendon defect. Normal Thompson test. Passive plantar flexion and dorsiflexion intact. Compartments soft. No calf edema or swelling.  Neurovascularly intact distally to site of injury.  Neurological: She is alert.  Skin: Skin is warm, dry and intact. Capillary refill takes less than 2 seconds. No erythema. No pallor.  Psychiatric: She has a normal mood and affect.  Nursing note and vitals reviewed.    ED Treatments / Results  Labs (all labs ordered are listed, but only abnormal results are displayed) Labs Reviewed - No data to display  EKG  EKG Interpretation None       Radiology Dg Ankle Complete Left  Result Date: 08/11/2017 CLINICAL DATA:  Acute onset pain EXAM: LEFT ANKLE COMPLETE - 3+ VIEW COMPARISON:  None. FINDINGS: Frontal, oblique, and lateral views were obtained. No fracture or joint effusion. No appreciable joint space narrowing or erosion. Ankle mortise appears intact. IMPRESSION: No demonstrable fracture or arthropathy. Ankle mortise appears intact. Electronically Signed   By: Bretta Bang III M.D.   On: 08/11/2017 20:35    Procedures Procedures (including critical care time)  Medications Ordered in ED Medications  HYDROcodone-acetaminophen (NORCO/VICODIN) 5-325 MG per tablet 1 tablet (1 tablet Oral Given 08/11/17 2053)     Initial Impression / Assessment and Plan / ED Course  I have reviewed the triage vital signs and the  nursing notes.  Pertinent labs & imaging results that were available during my care of the patient were reviewed by me and considered in my medical decision making (see chart for details).     26 year old presenting with question of atraumatic left ankle pain. Patient is without fever, warmth, redness and has able ROM that make me believe this is not a septic joint. Patient X-Ray negative for obvious fracture or dislocation. As there is no DVT study at [facility] and the patient does have history of estrogen use (BC) with some leg swelling that appears to be atraumatic advised patient Lovenox shot tonight and DVT study in morning but patient refused stating "I'm sure I rolled it, or tripped over my foot or something. The lady walking with me was just talking so much and so fast that I was  distracted when it happened. I just need pain medication for it". I advised the patient that she can return for evaluation of this and signs to look out for. Her pain was managed in ED.  Pt given follow up referral. Patient given brace and crutches while in ED, conservative therapy recommended and discussed. Patient will be dc home & is agreeable with above plan.   Final Clinical Impressions(s) / ED Diagnoses   Final diagnoses:  Acute left ankle pain    New Prescriptions Discharge Medication List as of 08/11/2017  9:43 PM    START taking these medications   Details  naproxen (NAPROSYN) 500 MG tablet Take 1 tablet (500 mg total) by mouth 2 (two) times daily., Starting Fri 08/11/2017, Print         Chamia Schmutz, Elmer Sow, PA-C 08/11/17 2336    Shaune Pollack, MD 08/12/17 1204

## 2017-08-11 NOTE — ED Notes (Signed)
Ortho tech at bedside 

## 2017-08-11 NOTE — Discharge Instructions (Signed)
Please read and follow all provided instructions.  You have been seen today for ankle pain  Tests performed today include: An x-ray of the affected area - does NOT show any broken bones or dislocations.  Vital signs. See below for your results today.   Home care instructions: -- *PRICE in the first 24-48 hours after injury: Protect (with brace, splint, sling), if given by your provider Rest Ice- Do not apply ice pack directly to your skin, place towel or similar between your skin and ice/ice pack. Apply ice for 20 min, then remove for 40 min while awake Compression- Wear brace, elastic bandage, splint as directed by your provider Elevate affected extremity above the level of your heart when not walking around for the first 24-48 hours   For activity: Use crutches with nonweightbearing for the first few days. Then, you may walk on your ankles as the pain allows, or as instructed. Start gradually with weight bearing on the affected ankle. Once you can walk pain free, then try jogging. When you can run forwards, then you can try moving side to side. If you cannot walk without crutches in one week, you need a recheck by your Family Doctor.   Use naproxen as directed for pain  Follow-up instructions: Please follow-up with your primary care provider or the provided orthopedic physician (bone specialist) if you continue to have significant pain in 1 week. In this case you may have a more severe injury that requires further care.   Return instructions:  Please return if your toes or feet are numb or tingling, appear gray or blue, or you have severe pain (also elevate the leg and loosen splint or wrap if you were given one) You decided you did not want to be evaluated for a DVT today. If you have worsening swelling of the leg extending into the calf, you can return for eval.  If your ankle becomes red, hot, swollen, and unable to move. Please return to the Emergency Department if you experience  worsening symptoms.  Please return if you have any other emergent concerns. Additional Information:  Your vital signs today were: BP (!) 144/90    Pulse 96    Temp 98.4 F (36.9 C) (Oral)    Resp 16    LMP 01/20/2017    SpO2 100%  If your blood pressure (BP) was elevated above 135/85 this visit, please have this repeated by your doctor within one month. ---------------

## 2017-08-11 NOTE — ED Triage Notes (Signed)
Pt states that she was at work today and her L ankle suddenly started throbbing. She denies falling or twisting it. She states that she does not know how she hurt it. She states that she is unable to stand on it. A&Ox4. Denies LOC or head injury.

## 2017-09-13 ENCOUNTER — Encounter (HOSPITAL_COMMUNITY): Payer: Self-pay | Admitting: *Deleted

## 2017-09-13 ENCOUNTER — Inpatient Hospital Stay (HOSPITAL_COMMUNITY)
Admission: AD | Admit: 2017-09-13 | Discharge: 2017-09-13 | Disposition: A | Discharge status: Home / Self Care | Payer: Self-pay | Source: Ambulatory Visit | Attending: Obstetrics and Gynecology | Admitting: Obstetrics and Gynecology

## 2017-09-13 DIAGNOSIS — N921 Excessive and frequent menstruation with irregular cycle: Secondary | ICD-10-CM

## 2017-09-13 DIAGNOSIS — T819XXA Unspecified complication of procedure, initial encounter: Secondary | ICD-10-CM

## 2017-09-13 DIAGNOSIS — N939 Abnormal uterine and vaginal bleeding, unspecified: Secondary | ICD-10-CM | POA: Insufficient documentation

## 2017-09-13 HISTORY — DX: Unspecified infectious disease: B99.9

## 2017-09-13 LAB — CBC
HCT: 37.5 % (ref 36.0–46.0)
Hemoglobin: 12.5 g/dL (ref 12.0–15.0)
MCH: 30.6 pg (ref 26.0–34.0)
MCHC: 33.3 g/dL (ref 30.0–36.0)
MCV: 91.9 fL (ref 78.0–100.0)
Platelets: 250 10*3/uL (ref 150–400)
RBC: 4.08 MIL/uL (ref 3.87–5.11)
RDW: 13.2 % (ref 11.5–15.5)
WBC: 12.9 10*3/uL — AB (ref 4.0–10.5)

## 2017-09-13 LAB — WET PREP, GENITAL
Clue Cells Wet Prep HPF POC: NONE SEEN
SPERM: NONE SEEN
TRICH WET PREP: NONE SEEN
YEAST WET PREP: NONE SEEN

## 2017-09-13 LAB — POCT PREGNANCY, URINE: Preg Test, Ur: NEGATIVE

## 2017-09-13 MED ORDER — MEDROXYPROGESTERONE ACETATE 10 MG PO TABS: 10.0000 mg | ORAL_TABLET | Freq: Every day | ORAL | 2 refills | Status: DC

## 2017-09-13 NOTE — MAU Note (Signed)
Urine in lab 

## 2017-09-13 NOTE — MAU Provider Note (Signed)
Chief Complaint: Vaginal Bleeding   First Provider Initiated Contact with Patient 09/13/17 1740      SUBJECTIVE HPI: Katherine Robbins is a 26 y.o. G2P1011 who presents to maternity admissions reporting brown spotting almost every day since her D&C in April. She had her D&C after a miscarriage, then had Depo Provera injection in May and has had this light but regular bleeding ever since.  She did not do her next dose of Depo and is interested in becoming pregnant again but has no periods to time, just the constant spotting. She has not tried any treatments.  Her surgery was done by [facility] but the pt prefers not to return to them.  She reports she never had follow up after her surgery because they said she was [facility] and did not need follow up.  She denies abdominal pain, n/v or any other associated symptoms.  She denies vaginal itching/burning, urinary symptoms, h/a, dizziness, n/v, or fever/chills.     HPI  Past Medical History:  Diagnosis Date  . Blunt abdominal trauma 06/25/2012  . Chlamydia   . Infection    UTI  . Normal pregnancy 06/25/2012  . Ovarian cyst   . S/P cesarean section 07/01/2012   Past Surgical History:  Procedure Laterality Date  . CESAREAN SECTION  07/01/2012   Procedure: CESAREAN SECTION;  Surgeon: Sherron Monday, MD;  Location: [facility];  Service: Gynecology;  Laterality: N/A;  Primary cesarean section with delivery of baby boy at 44.  Marland Kitchen DILATION AND EVACUATION N/A 03/14/2017   Procedure: DILATATION AND EVACUATION;  Surgeon: Harold Hedge, MD;  Location: [facility];  Service: Gynecology;  Laterality: N/A;  . NO PAST SURGERIES     Social History   Social History  . Marital status: Single    Spouse name: N/A  . Number of children: N/A  . Years of education: N/A   Occupational History  . Not on file.   Social History Main Topics  . Smoking status: Never Smoker  . Smokeless tobacco: Never Used  . Alcohol use No  . Drug use: No  . Sexual activity: Yes     Birth control/ protection: None   Other Topics Concern  . Not on file   Social History Narrative  . No narrative on file   No current facility-administered medications on file prior to encounter.    Current Outpatient Prescriptions on File Prior to Encounter  Medication Sig Dispense Refill  . acetaminophen (TYLENOL) 500 MG tablet Take 1,000 mg by mouth daily as needed for mild pain or headache. For leg pain     . calcium carbonate (TUMS EX) 750 MG chewable tablet Chew 2 tablets by mouth every 8 (eight) hours as needed for heartburn.     . naproxen (NAPROSYN) 500 MG tablet Take 1 tablet (500 mg total) by mouth 2 (two) times daily. 30 tablet 0  . oxyCODONE-acetaminophen (ROXICET) 5-325 MG tablet Take 1-2 tablets by mouth every 6 (six) hours as needed for severe pain. 6 tablet 0  . Prenatal Vit-Fe Fumarate-FA (PRENATAL MULTIVITAMIN) TABS Take 1 tablet by mouth every morning. 30 tablet 12   Allergies  Allergen Reactions  . Apple Other (See Comments)    Throat and ears itch  . Shellfish Allergy Itching    Throat and ear itch    ROS:  Review of Systems  Constitutional: Negative for chills, fatigue and fever.  Respiratory: Negative for shortness of breath.   Cardiovascular: Negative for chest pain.  Gastrointestinal:  Negative for nausea and vomiting.  Genitourinary: Positive for vaginal bleeding. Negative for difficulty urinating, dysuria, flank pain, pelvic pain, vaginal discharge and vaginal pain.  Neurological: Negative for dizziness and headaches.  Psychiatric/Behavioral: Negative.      I have reviewed patient's Past Medical Hx, Surgical Hx, Family Hx, Social Hx, medications and allergies.   Physical Exam  Patient Vitals for the past 24 hrs:  BP Temp Temp src Pulse Resp SpO2 Weight  09/13/17 1606 131/79 98.9 F (37.2 C) Oral 90 16 100 % 154 lb 8 oz (70.1 kg)   Constitutional: Well-developed, well-nourished female in no acute distress.  Cardiovascular: normal  rate Respiratory: normal effort GI: Abd soft, non-tender. Pos BS x 4 MS: Extremities nontender, no edema, normal ROM Neurologic: Alert and oriented x 4.  GU: Neg CVAT.  PELVIC EXAM: Cervix pink, visually closed, without lesion, small amount dark red/brown bleeding, vaginal walls and external genitalia normal Bimanual exam: Cervix 0/long/high, firm, anterior, neg CMT, uterus nontender, nonenlarged, adnexa without tenderness, enlargement, or mass  LAB RESULTS Results for orders placed or performed during the hospital encounter of 09/13/17 (from the past 24 hour(s))  Pregnancy, urine POC     Status: None   Collection Time: 09/13/17  4:17 PM  Result Value Ref Range   Preg Test, Ur NEGATIVE NEGATIVE  CBC     Status: Abnormal   Collection Time: 09/13/17  5:57 PM  Result Value Ref Range   WBC 12.9 (H) 4.0 - 10.5 K/uL   RBC 4.08 3.87 - 5.11 MIL/uL   Hemoglobin 12.5 12.0 - 15.0 g/dL   HCT 46.937.5 62.936.0 - 52.846.0 %   MCV 91.9 78.0 - 100.0 fL   MCH 30.6 26.0 - 34.0 pg   MCHC 33.3 30.0 - 36.0 g/dL   RDW 41.313.2 24.411.5 - 01.015.5 %   Platelets 250 150 - 400 K/uL  Wet prep, genital     Status: Abnormal   Collection Time: 09/13/17  6:30 PM  Result Value Ref Range   Yeast Wet Prep HPF POC NONE SEEN NONE SEEN   Trich, Wet Prep NONE SEEN NONE SEEN   Clue Cells Wet Prep HPF POC NONE SEEN NONE SEEN   WBC, Wet Prep HPF POC FEW (A) NONE SEEN   Sperm NONE SEEN     --/--/O NEG (04/17 0930)  IMAGING No results found.  MAU Management/MDM: Ordered labs and reviewed results.  WBCs slightly elevated but no fever/chills or pain today.  AUB likely related to Depo but pt concerned about surgical scarring or other complications.  Will try Provera x 10 days as progesterone challenge to stop bleeding and encourage more normal menses and possibly ovulation.  Outpatient US ordered and [facility].  Pt stable at time of discharge.  ASSESSMENT 1. Abnormal uterine bleeding (AUB)   2. Breakthrough  bleeding on depo provera   3. Complication of procedure, initial encounter     PLAN Discharge home Allergies as of 09/13/2017      Reactions   Apple Other (See Comments)   Throat and ears itch   Shellfish Allergy Itching   Throat and ear itch      Medication List    STOP taking these medications   oxyCODONE-acetaminophen 5-325 MG tablet Commonly known as:  ROXICET     TAKE these medications   acetaminophen 500 MG tablet Commonly known as:  TYLENOL Take 1,000 mg by mouth daily as needed for mild pain or headache.  For leg pain   calcium carbonate 750 MG chewable tablet Commonly known as:  TUMS EX Chew 2 tablets by mouth every 8 (eight) hours as needed for heartburn.   medroxyPROGESTERone 10 MG tablet Commonly known as:  PROVERA Take 1 tablet (10 mg total) by mouth daily. Use for ten days   naproxen 500 MG tablet Commonly known as:  NAPROSYN Take 1 tablet (500 mg total) by mouth 2 (two) times daily.   prenatal multivitamin Tabs tablet Take 1 tablet by mouth every morning.        Sharen Counter Certified Nurse-Midwife 09/13/2017  7:10 PM

## 2017-09-13 NOTE — MAU Note (Signed)
In April had a miscarriage and a D&C.  Has had brown stuff off and on since procedure.  Pt never really came on.  Got depo in May. Did not go back at 11 wks for depo, never followed up after D&C.  Had a period 10/1, lasted 7 days. Yesterday had some pink stuff in the morning, was brown by last night.  Today at work, started bleeding- is heavy. (changed 3 times)

## 2017-09-14 LAB — GC/CHLAMYDIA PROBE AMP (~~LOC~~) NOT AT ARMC
CHLAMYDIA, DNA PROBE: NEGATIVE
Neisseria Gonorrhea: NEGATIVE

## 2017-09-22 ENCOUNTER — Ambulatory Visit (HOSPITAL_COMMUNITY): Admission: RE | Admit: 2017-09-22 | Payer: Self-pay | Source: Ambulatory Visit

## 2017-10-02 ENCOUNTER — Encounter: Payer: Self-pay | Admitting: Medical

## 2017-11-14 ENCOUNTER — Other Ambulatory Visit: Payer: Self-pay

## 2017-11-14 ENCOUNTER — Encounter (HOSPITAL_COMMUNITY): Payer: Self-pay

## 2017-11-14 ENCOUNTER — Inpatient Hospital Stay (HOSPITAL_COMMUNITY)
Admission: AD | Admit: 2017-11-14 | Discharge: 2017-11-14 | Disposition: A | Discharge status: Home / Self Care | Payer: Self-pay | Source: Ambulatory Visit | Attending: Family Medicine | Admitting: Family Medicine

## 2017-11-14 DIAGNOSIS — B373 Candidiasis of vulva and vagina: Secondary | ICD-10-CM | POA: Insufficient documentation

## 2017-11-14 DIAGNOSIS — B3731 Acute candidiasis of vulva and vagina: Secondary | ICD-10-CM

## 2017-11-14 DIAGNOSIS — N898 Other specified noninflammatory disorders of vagina: Secondary | ICD-10-CM

## 2017-11-14 LAB — URINALYSIS, ROUTINE W REFLEX MICROSCOPIC
BILIRUBIN URINE: NEGATIVE
GLUCOSE, UA: NEGATIVE mg/dL
KETONES UR: NEGATIVE mg/dL
NITRITE: NEGATIVE
PH: 6 (ref 5.0–8.0)
Protein, ur: NEGATIVE mg/dL
Specific Gravity, Urine: 1.025 (ref 1.005–1.030)

## 2017-11-14 LAB — POCT PREGNANCY, URINE: Preg Test, Ur: NEGATIVE

## 2017-11-14 LAB — WET PREP, GENITAL
Clue Cells Wet Prep HPF POC: NONE SEEN
SPERM: NONE SEEN
Trich, Wet Prep: NONE SEEN
YEAST WET PREP: NONE SEEN

## 2017-11-14 MED ORDER — FLUCONAZOLE 150 MG PO TABS: 150.0000 mg | ORAL_TABLET | Freq: Once | ORAL | 0 refills | Status: DC

## 2017-11-14 NOTE — Discharge Instructions (Signed)

## 2017-11-14 NOTE — MAU Note (Signed)
Vaginal discomfort with intercourse last Tues, reports it was hurting.  No bleeding. White discharge started Sunday with itching.

## 2017-11-14 NOTE — MAU Provider Note (Signed)
History  CSN: [id number] Arrival date and time: 11/14/17 2044  First Provider Initiated Contact with Patient 11/14/17 2141      Chief Complaint  Patient presents with  . Vaginal Discharge  . Vaginal Itching    HPI: Katherine Robbins is a 26 y.o. [phone] who presents to maternity admissions reporting vaginal itching and discharge for two days. Denies vaginal bleeding, abdominal pain, cramping, urinary symptoms, nausea, vomiting, diarrhea, or fever/chills. Repors some discomfort with sexual intercourse last night. LMP 2 weeks ago.    OB History  Gravida Para Term Preterm AB Living  3 1 1  0 2 1  SAB TAB Ectopic Multiple Live Births  2 0 0 0 1    # Outcome Date GA Lbr Len/2nd Weight Sex Delivery Anes PTL Lv  3 Term 07/01/12 [id number]   M CS-LTranv EPI  LIV  2 SAB           1 SAB              Past Medical History:  Diagnosis Date  . Blunt abdominal trauma 06/25/2012  . Chlamydia   . Infection    UTI  . Normal pregnancy 06/25/2012  . Ovarian cyst   . S/P cesarean section 07/01/2012   Past Surgical History:  Procedure Laterality Date  . CESAREAN SECTION  07/01/2012   Procedure: CESAREAN SECTION;  Surgeon: Sherron MondayJody Bovard, MD;  Location: [facility];  Service: Gynecology;  Laterality: N/A;  Primary cesarean section with delivery of baby boy at [id number].  Marland Kitchen. DILATION AND EVACUATION N/A 03/14/2017   Procedure: DILATATION AND EVACUATION;  Surgeon: Harold HedgeJames Tomblin, MD;  Location: [facility];  Service: Gynecology;  Laterality: N/A;  . NO PAST SURGERIES     Social History   Socioeconomic History  . Marital status: Single    Spouse name: Not on file  . Number of children: Not on file  . Years of education: Not on file  . Highest education level: Not on file  Social Needs  . Financial resource strain: Not on file  . Food insecurity - worry: Not on file  . Food insecurity - inability: Not on file  . Transportation needs - medical: Not on file  . Transportation needs - non-medical: Not on file   Occupational History  . Not on file  Tobacco Use  . Smoking status: Never Smoker  . Smokeless tobacco: Never Used  Substance and Sexual Activity  . Alcohol use: No  . Drug use: No  . Sexual activity: Yes    Birth control/protection: None  Other Topics Concern  . Not on file  Social History Narrative  . Not on file   Allergies  Allergen Reactions  . Apple Other (See Comments)    Throat and ears itch  . Shellfish Allergy Itching    Throat and ear itch    Medications Prior to Admission  Medication Sig Dispense Refill Last Dose  . acetaminophen (TYLENOL) 500 MG tablet Take 1,000 mg by mouth daily as needed for mild pain or headache. For leg pain    Past Month at Unknown time  . medroxyPROGESTERone (PROVERA) 10 MG tablet Take 1 tablet (10 mg total) by mouth daily. Use for ten days (Patient not taking: Reported on 11/14/2017) 10 tablet 2 Not Taking at Unknown time  . naproxen (NAPROSYN) 500 MG tablet Take 1 tablet (500 mg total) by mouth 2 (two) times daily. (Patient not taking: Reported on 11/14/2017) 30 tablet 0 Not Taking at Unknown time  I have reviewed patient's Past Medical Hx, Surgical Hx, Family Hx, Social Hx, medications and allergies.   Review of Systems: Negative except for what is mentioned in HPI.  Physical Exam   Blood pressure 119/70, pulse 89, temperature 98.4 F (36.9 C), temperature source Oral, resp. rate 16, height 5' (1.524 m), weight 156 lb (70.8 kg), last menstrual period 10/29/2017, SpO2 99 %, unknown if currently breastfeeding.  Constitutional: Well-developed, well-nourished female in no acute distress.  HENT: [place]/AT, normal oropharynx mucosa. MMM Eyes: normal conjunctivae, no scleral icterus Cardiovascular: normal rate Respiratory: normal effort GI: Abd soft, non-tender, non-distended Pelvic: NEFG. Normal vaginal mucosa without lesions; cervix pink, visually closed, without lesion; thick scattered discharge without odor. No uterine tenderness, neg  CMT, uterus nonenlarged, adnexa without tenderness, enlargement, or  Palpable mass MSK: Extremities nontender, no edema Neurologic: Alert and oriented x 4. Psych: Normal mood and affect Skin: warm and dry    MAU Course/MDM:   Nursing notes and VS reviewed. Patient seen and examined, as noted above.  UPT neg Wet prep negative, but discharge characteristic of yeast vaginitis c/w symptoms.  GC/Chlamydia probe also collected.   Assessment and Plan  Assessment: 1. Vaginal itching   2. Yeast vaginitis     Plan: --Rx for diflucan po --Discharge home in stable condition.   Jerine Surles, Kandra NicolasJulie P, MD 11/14/2017 10:36 PM

## 2017-11-15 ENCOUNTER — Other Ambulatory Visit: Payer: Self-pay | Admitting: Family Medicine

## 2017-11-15 LAB — GC/CHLAMYDIA PROBE AMP (~~LOC~~) NOT AT ARMC
Chlamydia: NEGATIVE
NEISSERIA GONORRHEA: NEGATIVE

## 2017-11-15 MED ORDER — FLUCONAZOLE 150 MG PO TABS: 150.0000 mg | ORAL_TABLET | Freq: Once | ORAL | 0 refills | Status: AC

## 2017-11-15 MED ORDER — FLUCONAZOLE 150 MG PO TABS: 150.0000 mg | ORAL_TABLET | Freq: Once | ORAL | 0 refills | Status: DC

## 2018-01-23 ENCOUNTER — Ambulatory Visit (HOSPITAL_COMMUNITY)
Admission: EM | Admit: 2018-01-23 | Discharge: 2018-01-23 | Disposition: A | Discharge status: Home / Self Care | Payer: Self-pay | Attending: Family Medicine | Admitting: Family Medicine

## 2018-01-23 ENCOUNTER — Other Ambulatory Visit: Payer: Self-pay

## 2018-01-23 ENCOUNTER — Encounter (HOSPITAL_COMMUNITY): Payer: Self-pay | Admitting: Emergency Medicine

## 2018-01-23 DIAGNOSIS — R6889 Other general symptoms and signs: Secondary | ICD-10-CM

## 2018-01-23 MED ORDER — OSELTAMIVIR PHOSPHATE 75 MG PO CAPS: 75.0000 mg | ORAL_CAPSULE | Freq: Two times a day (BID) | ORAL | 0 refills | Status: AC

## 2018-01-23 NOTE — ED Provider Notes (Signed)
[facility]    CSN: [id number] Arrival date & time: 01/23/18  1152     History   Chief Complaint Chief Complaint  Patient presents with  . URI    HPI Katherine Robbins is a 27 y.o. female.   Sudden onset last night with running nose, fatigue, body aches, headache and chills. Denies fever at home. Temp is 99.2 in urgent care today. Son was diagnosed with confirmed flu 3 days ago. Denies any alleviating or aggravating factors. Denies any modifying factors.      Past Medical History:  Diagnosis Date  . Blunt abdominal trauma 06/25/2012  . Chlamydia   . Infection    UTI  . Normal pregnancy 06/25/2012  . Ovarian cyst   . S/P cesarean section 07/01/2012    Patient Active Problem List   Diagnosis Date Noted  . S/P cesarean section 07/01/2012  . Normal pregnancy 06/25/2012    Past Surgical History:  Procedure Laterality Date  . CESAREAN SECTION  07/01/2012   Procedure: CESAREAN SECTION;  Surgeon: Sherron MondayJody Bovard, MD;  Location: [facility];  Service: Gynecology;  Laterality: N/A;  Primary cesarean section with delivery of baby boy at [id number].  Marland Kitchen. DILATION AND EVACUATION N/A 03/14/2017   Procedure: DILATATION AND EVACUATION;  Surgeon: Harold HedgeJames Tomblin, MD;  Location: [facility];  Service: Gynecology;  Laterality: N/A;  . NO PAST SURGERIES      OB History    Gravida Para Term Preterm AB Living   3 1 1  0 2 1   SAB TAB Ectopic Multiple Live Births   2 0 0 0 1       Home Medications    Prior to Admission medications   Medication Sig Start Date End Date Taking? Authorizing Provider  acetaminophen (TYLENOL) 500 MG tablet Take 1,000 mg by mouth daily as needed for mild pain or headache. For leg pain    Yes [provider]  medroxyPROGESTERone (PROVERA) 10 MG tablet Take 1 tablet (10 mg total) by mouth daily. Use for ten days Patient not taking: Reported on 11/14/2017 09/13/17   Sharen CounterLeftwich-Kirby, Lisa A, CNM  naproxen (NAPROSYN) 500 MG tablet Take 1 tablet (500 mg total) by  mouth 2 (two) times daily. Patient not taking: Reported on 11/14/2017 08/11/17   Maczis, Elmer SowMichael M, PA-C  oseltamivir (TAMIFLU) 75 MG capsule Take 1 capsule (75 mg total) by mouth every 12 (twelve) hours for 5 days. 01/23/18 01/28/18  Lucia EstelleZheng, Dave Mannes, NP    Family History Family History  Problem Relation Age of Onset  . Diabetes Paternal Grandmother   . Kidney disease Paternal Grandmother   . Hypertension Mother   . Kidney disease Father   . Hypertension Maternal Grandfather   . Anesthesia problems Neg Hx     Social History Social History   Tobacco Use  . Smoking status: Never Smoker  . Smokeless tobacco: Never Used  Substance Use Topics  . Alcohol use: No  . Drug use: No     Allergies   Apple and Shellfish allergy   Review of Systems Review of Systems  Constitutional:       History of present illness  Respiratory: Negative.   Cardiovascular: Negative.      Physical Exam Triage Vital Signs ED Triage Vitals  Enc Vitals Group     BP 01/23/18 1323 113/83     Pulse Rate 01/23/18 1323 (!) 104     Resp --      Temp 01/23/18 1323 99.2 F (37.3 C)  Temp Source 01/23/18 1323 Oral     SpO2 01/23/18 1323 97 %     Weight --      Height --      Head Circumference --      Peak Flow --      Pain Score 01/23/18 1321 9     Pain Loc --      Pain Edu? --      Excl. in GC? --    No data found.  Updated Vital Signs BP 113/83 (BP Location: Right Arm)   Pulse (!) 104   Temp 99.2 F (37.3 C) (Oral)   SpO2 97%   Visual Acuity Right Eye Distance:   Left Eye Distance:   Bilateral Distance:    Right Eye Near:   Left Eye Near:    Bilateral Near:     Physical Exam  Constitutional: She appears well-developed and well-nourished. No distress.  HENT:  Head: Normocephalic and atraumatic.  Nose: Nose normal.  Mouth/Throat: Oropharynx is clear and moist. No oropharyngeal exudate.  TM pearly gray bilaterally with no erythema.  Eyes: Conjunctivae are normal. Pupils are  equal, round, and reactive to light.  Neck: Normal range of motion. Neck supple.  Cardiovascular: Normal rate, regular rhythm and normal heart sounds.  Pulmonary/Chest: Effort normal and breath sounds normal. She has no wheezes.  Abdominal: Soft. Bowel sounds are normal. There is no tenderness.  Skin: Skin is warm and dry. She is not diaphoretic.  Nursing note and vitals reviewed.    UC Treatments / Results  Labs (all labs ordered are listed, but only abnormal results are displayed) Labs Reviewed - No data to display  EKG  EKG Interpretation None       Radiology No results found.  Procedures Procedures (including critical care time)  Medications Ordered in UC Medications - No data to display   Initial Impression / Assessment and Plan / UC Course  I have reviewed the triage vital signs and the nursing notes.  Pertinent labs & imaging results that were available during my care of the patient were reviewed by me and considered in my medical decision making (see chart for details).   Final Clinical Impressions(s) / UC Diagnoses   Final diagnoses:  Flu-like symptoms   Patient presents today for flu-like symptoms. Her son had confirmed influenza 3 days ago. We'll treat patient empirically with Tamiflu twice a day 5 days. Could represent hydration. Over-the-counter supportive treatment discussed.  ED Discharge Orders        Ordered    oseltamivir (TAMIFLU) 75 MG capsule  Every 12 hours     01/23/18 1338     Controlled Substance Prescriptions [place] Controlled Substance Registry consulted? Not Applicable   Lucia Estelle, NP 01/23/18 1343

## 2018-01-23 NOTE — ED Triage Notes (Signed)
Pt reports nasal congestion and drainage, chest congestion and cough that started yesterday.  She states her son was diagnose with flu on Sunday.

## 2018-04-15 IMAGING — CR DG ANKLE COMPLETE 3+V*L*
3 series · 3 of 3 positions shown · non-contrast
Comparison: None.

CLINICAL DATA: Acute onset pain

EXAM:
LEFT ANKLE COMPLETE - 3+ VIEW

[x ankle ap left]
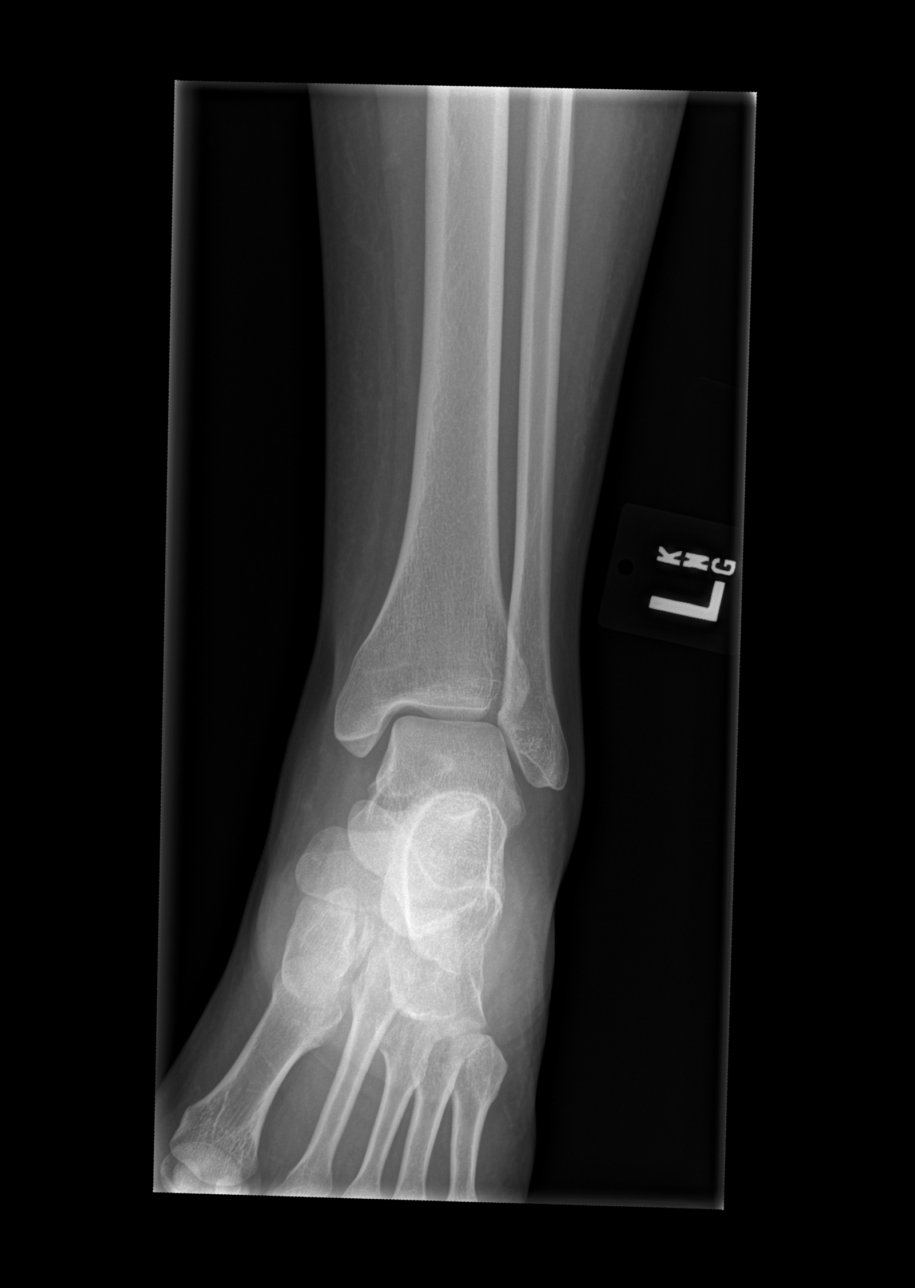

[x ankle obl left]
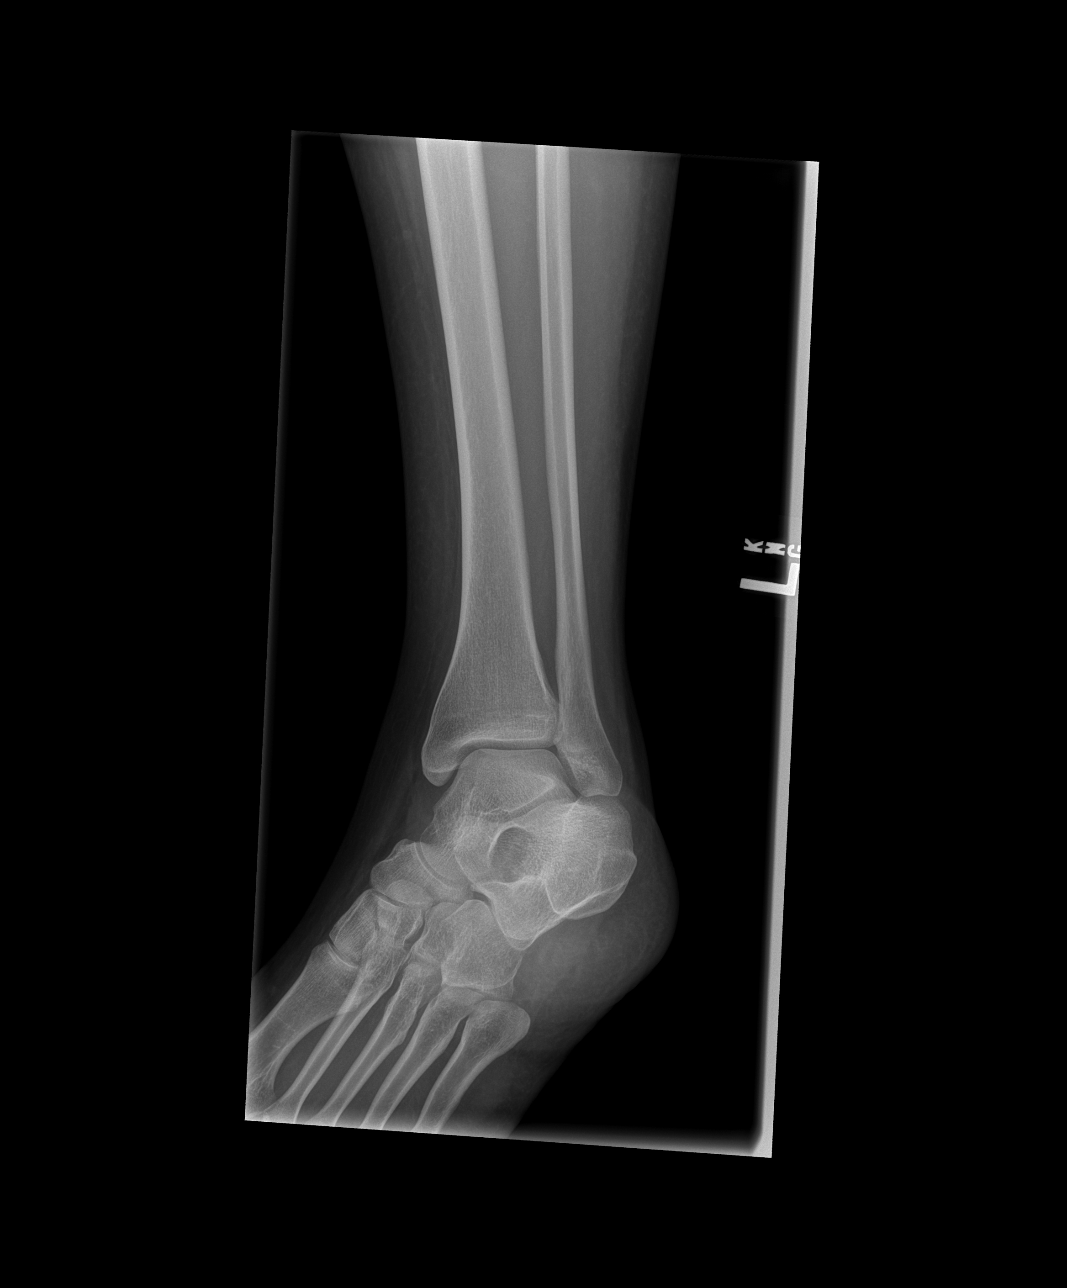

[x ankle lat left]
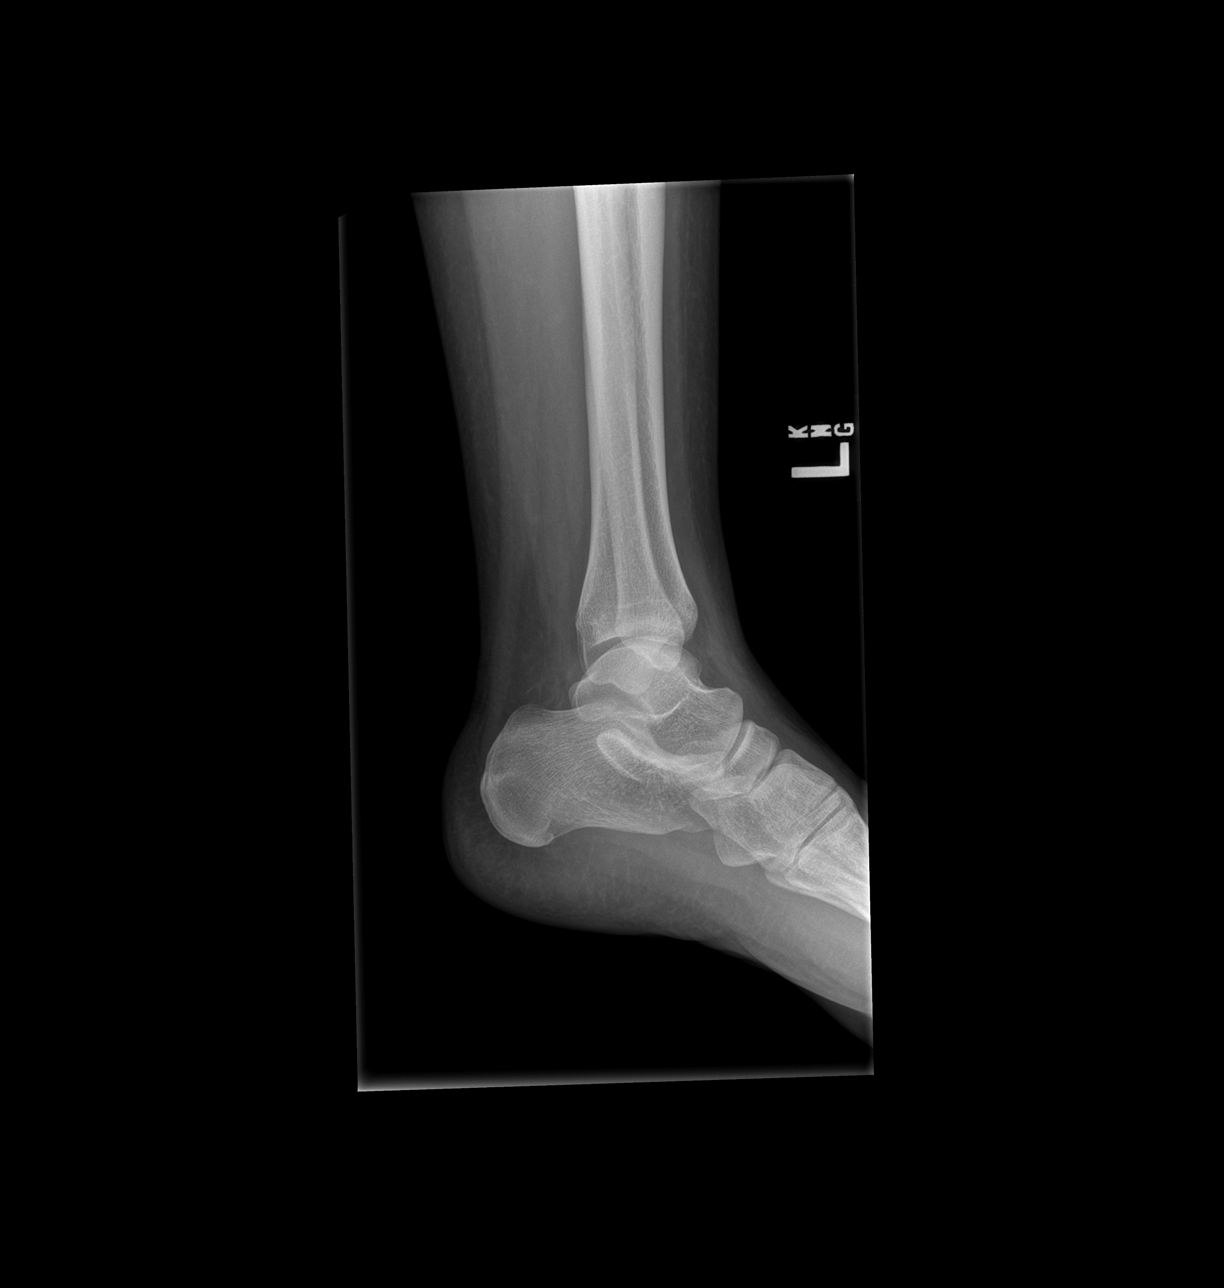

[3 of 3 positions shown; findings below may reference images not displayed]

FINDINGS: Frontal, oblique, and lateral views were obtained. No fracture or
joint effusion. No appreciable joint space narrowing or erosion.
Ankle mortise appears intact.
IMPRESSION: No demonstrable fracture or arthropathy. Ankle mortise appears
intact.

## 2020-01-24 ENCOUNTER — Other Ambulatory Visit: Payer: Self-pay

## 2020-01-24 ENCOUNTER — Encounter (HOSPITAL_COMMUNITY): Payer: Self-pay

## 2020-01-24 ENCOUNTER — Ambulatory Visit (HOSPITAL_COMMUNITY)
Admission: EM | Admit: 2020-01-24 | Discharge: 2020-01-24 | Disposition: A | Discharge status: Home / Self Care | Payer: BC Managed Care – PPO

## 2020-01-24 DIAGNOSIS — R040 Epistaxis: Secondary | ICD-10-CM

## 2020-01-24 DIAGNOSIS — J3089 Other allergic rhinitis: Secondary | ICD-10-CM

## 2020-01-24 DIAGNOSIS — R0981 Nasal congestion: Secondary | ICD-10-CM

## 2020-01-24 MED ORDER — OXYMETAZOLINE HCL 0.05 % NA SOLN
1.0000 | Freq: Two times a day (BID) | NASAL | Status: DC
  Administered 2020-01-24: 15:00:00 1 via NASAL

## 2020-01-24 MED ORDER — PSEUDOEPHEDRINE HCL 60 MG PO TABS: 60.0000 mg | ORAL_TABLET | Freq: Three times a day (TID) | ORAL | 0 refills | Status: DC | PRN

## 2020-01-24 MED ORDER — OXYMETAZOLINE HCL 0.05 % NA SOLN
NASAL | Status: AC
  Filled 2020-01-24: qty 30

## 2020-01-24 MED ORDER — LEVOCETIRIZINE DIHYDROCHLORIDE 5 MG PO TABS: 5.0000 mg | ORAL_TABLET | Freq: Every evening | ORAL | 0 refills | Status: DC

## 2020-01-24 NOTE — ED Provider Notes (Signed)
[facility]   MRN: [id number] DOB: [birth year]  Subjective:   Katherine Robbins is a 29 y.o. female presenting for 4-day history of persistent sinus congestion now having frequent nosebleeds.  Patient feels postnasal drainage and has been spitting out blood from her sinus.  She has used an over-the-counter sinus spray recommended by the pharmacist.  Has also previously used Flonase.  States that she has persistent rhinitis around this time of year and becomes very severe.  She is not currently on an oral antihistamine or taking pseudoephedrine.  Has never had difficulty with nosebleeds before.  Does not have an ENT.  Patient has had general exposure to the public through her work, works at [employer].  She refuses COVID-19 testing however.  No current facility-administered medications for this encounter.  Current Outpatient Medications:  .  acetaminophen (TYLENOL) 500 MG tablet, Take 1,000 mg by mouth daily as needed for mild pain or headache. For leg pain , Disp: , Rfl:  .  medroxyPROGESTERone (PROVERA) 10 MG tablet, Take 1 tablet (10 mg total) by mouth daily. Use for ten days (Patient not taking: Reported on 11/14/2017), Disp: 10 tablet, Rfl: 2 .  naproxen (NAPROSYN) 500 MG tablet, Take 1 tablet (500 mg total) by mouth 2 (two) times daily. (Patient not taking: Reported on 11/14/2017), Disp: 30 tablet, Rfl: 0 .  sulfamethoxazole-trimethoprim (BACTRIM DS) 800-160 MG tablet, sulfamethoxazole 800 mg-trimethoprim 160 mg tablet  TAKE 1 TABLET BY MOUTH EVERY 12 HOURS X 5 DAYS AS DIRECTED, Disp: , Rfl:    Allergies  Allergen Reactions  . Apple Other (See Comments)    Throat and ears itch  . Shellfish Allergy Itching    Throat and ear itch    Past Medical History:  Diagnosis Date  . Blunt abdominal trauma 06/25/2012  . Chlamydia   . Infection    UTI  . Normal pregnancy 06/25/2012  . Ovarian cyst   . S/P cesarean section 07/01/2012     Past Surgical History:  Procedure Laterality  Date  . CESAREAN SECTION  07/01/2012   Procedure: CESAREAN SECTION;  Surgeon: Sherron Monday, MD;  Location: [facility];  Service: Gynecology;  Laterality: N/A;  Primary cesarean section with delivery of baby boy at 35.  Marland Kitchen DILATION AND EVACUATION N/A 03/14/2017   Procedure: DILATATION AND EVACUATION;  Surgeon: Harold Hedge, MD;  Location: [facility];  Service: Gynecology;  Laterality: N/A;  . NO PAST SURGERIES      Family History  Problem Relation Age of Onset  . Diabetes Paternal Grandmother   . Kidney disease Paternal Grandmother   . Hypertension Mother   . Kidney disease Father   . Hypertension Maternal Grandfather   . Anesthesia problems Neg Hx     Social History   Tobacco Use  . Smoking status: Never Smoker  . Smokeless tobacco: Never Used  Substance Use Topics  . Alcohol use: No  . Drug use: No    ROS   Objective:   Vitals: BP (!) 134/93 (BP Location: Left Arm)   Pulse 88   Temp 98.1 F (36.7 C) (Oral)   Resp 16   LMP 01/16/2020   SpO2 98%   Physical Exam Constitutional:      General: She is not in acute distress.    Appearance: She is well-developed. She is not ill-appearing, toxic-appearing or diaphoretic.  HENT:     Head: Normocephalic and atraumatic.     Right Ear: Tympanic membrane and ear canal normal. No drainage or  tenderness. No middle ear effusion. Tympanic membrane is not erythematous.     Left Ear: Tympanic membrane and ear canal normal. No drainage or tenderness.  No middle ear effusion. Tympanic membrane is not erythematous.     Nose: Congestion present. No rhinorrhea.     Comments: Controlled epistaxis. No active bleeding.    Mouth/Throat:     Mouth: Mucous membranes are moist. No oral lesions.     Pharynx: No pharyngeal swelling, oropharyngeal exudate, posterior oropharyngeal erythema or uvula swelling.     Tonsils: No tonsillar exudate or tonsillar abscesses.     Comments: Significant pnd.  Eyes:     General: No scleral icterus.       Right eye:  No discharge.        Left eye: No discharge.     Extraocular Movements:     Right eye: Normal extraocular motion.     Left eye: Normal extraocular motion.     Conjunctiva/sclera: Conjunctivae normal.     Pupils: Pupils are equal, round, and reactive to light.  Cardiovascular:     Rate and Rhythm: Normal rate.  Pulmonary:     Effort: Pulmonary effort is normal.  Musculoskeletal:     Cervical back: Normal range of motion and neck supple.  Lymphadenopathy:     Cervical: No cervical adenopathy.  Skin:    General: Skin is warm and dry.  Neurological:     General: No focal deficit present.     Mental Status: She is alert and oriented to person, place, and time.  Psychiatric:        Mood and Affect: Mood normal.        Behavior: Behavior normal.        Thought Content: Thought content normal.        Judgment: Judgment normal.      Assessment and Plan :   1. Sinus congestion   2. Epistaxis   3. Allergic rhinitis due to other allergic trigger, unspecified seasonality     Patient given Afrin in clinic, discussed appropriate use at home and warned of rebound sinus congestion with persistent use.  Counseled to stop all other nasal sprays that she has been using over-the-counter.  Recommended she start Xyzal and use pseudoephedrine.  Follow-up with [facility]NT in the event that patient has persistent nosebleeds.  She refused COVID-19 testing.  Counseled patient on potential for adverse effects with medications prescribed/recommended today, ER and return-to-clinic precautions discussed, patient verbalized understanding.    Jaynee Eagles, [place] 01/24/20 1547

## 2020-01-24 NOTE — ED Triage Notes (Signed)
Pt states she has had nasal congestion with frequent nose bleeds since Tuesday. No bleeding at present.  Denies HA, sore throat, fever, chills.  Pt states last nightshe started using a nasal spray recommended by pharmacist w/o improvement.

## 2020-02-25 ENCOUNTER — Ambulatory Visit: Payer: Self-pay | Admitting: Obstetrics and Gynecology

## 2021-06-09 ENCOUNTER — Other Ambulatory Visit: Payer: Self-pay

## 2021-06-09 ENCOUNTER — Other Ambulatory Visit (HOSPITAL_COMMUNITY)
Admission: RE | Admit: 2021-06-09 | Discharge: 2021-06-09 | Disposition: A | Discharge status: Home / Self Care | Payer: BC Managed Care – PPO | Source: Ambulatory Visit | Attending: Internal Medicine | Admitting: Internal Medicine

## 2021-06-09 ENCOUNTER — Ambulatory Visit: Payer: BC Managed Care – PPO | Admitting: Internal Medicine

## 2021-06-09 ENCOUNTER — Encounter: Payer: Self-pay | Admitting: Internal Medicine

## 2021-06-09 VITALS — BP 129/89 | HR 101 | Temp 99.0°F | Ht 60.0 in | Wt 178.7 lb

## 2021-06-09 DIAGNOSIS — Z0000 Encounter for general adult medical examination without abnormal findings: Secondary | ICD-10-CM | POA: Diagnosis not present

## 2021-06-09 DIAGNOSIS — Z01419 Encounter for gynecological examination (general) (routine) without abnormal findings: Secondary | ICD-10-CM | POA: Insufficient documentation

## 2021-06-09 DIAGNOSIS — F329 Major depressive disorder, single episode, unspecified: Secondary | ICD-10-CM | POA: Insufficient documentation

## 2021-06-09 DIAGNOSIS — F332 Major depressive disorder, recurrent severe without psychotic features: Secondary | ICD-10-CM

## 2021-06-09 MED ORDER — FLUOXETINE HCL 20 MG PO CAPS: 20.0000 mg | ORAL_CAPSULE | Freq: Every day | ORAL | 0 refills | Status: DC

## 2021-06-09 NOTE — Patient Instructions (Signed)
Thank you, Katherine Robbins Stable for allowing [place] to provide your care today. Today we discussed depression and healthcare maintenance.  Depression: We are going to restart you on Prozac. Start with 20mg  (1 pill) for 7 days, then increase to 40mg  (2 pills) for 7 days, then take 60 mg (3 pills) until your follow up appt. Please let know if you have any issues with these medications or if it still is not helping you.  Healthcare maintenance: We did your pap smear today to check for cervical cancer! I will let you know if you have any abnormal results.  We are also going to do some blood work today. I will also call you if any of these results are abnormal.   I have ordered the following labs for you:  Lab Orders  CBC no Diff  CMP14 + Anion Gap  Lipid Profile     Tests ordered today:  None  Referrals ordered today:   Referral Orders  Ambulatory referral to Integrated Behavioral Health    I have ordered the following medication/changed the following medications:   Stop the following medications: Medications Discontinued During This Encounter  Medication Reason   FLUoxetine (PROZAC) 40 MG capsule Dose change   buPROPion (WELLBUTRIN XL) 150 MG 24 hr tablet    naproxen (NAPROSYN) 500 MG tablet      Start the following medications: Meds ordered this encounter  Medications   FLUoxetine (PROZAC) 20 MG capsule    Sig: Take 1 capsule (20 mg total) by mouth daily for 105 doses. Take 1 capsule daily for 7 days, then 2 capsules by mouth for 7 days, then 3 capsules by mouth for 7 days.    Dispense:  105 capsule    Refill:  0     Follow up: 2 months    Remember: to start the new prozac prescription and to make an appt with Dr. !  Should you have any questions or concerns please call the internal medicine clinic at [phone].     [name], D.O. [facility]

## 2021-06-09 NOTE — Progress Notes (Signed)
New Patient Office Visit  Subjective:  Patient ID: Katherine Robbins, female    DOB: Jul 20, 1991  Age: 30 y.o. MRN: [id number]  CC:  Chief Complaint  Patient presents with   Follow-up    PATIENT IS NEW TO CLINIC TO BE ESTABLISHED / BILATERAL KNEE PAIN-CHRONIC / MEDICATION REFILL- REQUEST 3 MONTH SUPPLY    HPI Katherine Robbins presents today to establish care with the [facility] and for her annual well visit. She is complaining of bilateral chronic knee pain that began a few years ago.  Patient states that she uses BC powder for the pain which helps to give her some relief.  Pain remains stable. The patient also states that she is here because she needs refills of her medications. She was previously on Wellbutrin and Prozac, however, she has stopped taking these as she believed they were not helping her.  She also takes trazodone which helps her to sleep at night. The patient states that her depression has been bothering her recently. Scored a 21 on her PHQ-9.  She states that her depression has been causing her to overeat and has also caused her to have very low energy especially at work. The patient has not followed with a therapist but has been looking into this with her new insurance.  Overall she states that she feels well and is mostly just here to establish care.  Past Medical History:  Diagnosis Date   Blunt abdominal trauma 06/25/2012   Chlamydia    Infection    UTI   Normal pregnancy 06/25/2012   Ovarian cyst    S/P cesarean section 07/01/2012    Past Surgical History:  Procedure Laterality Date   CESAREAN SECTION  07/01/2012   Procedure: CESAREAN SECTION;  Surgeon: Sherron Monday, MD;  Location: [facility];  Service: Gynecology;  Laterality: N/A;  Primary cesarean section with delivery of baby boy at 60.   DILATION AND EVACUATION N/A 03/14/2017   Procedure: DILATATION AND EVACUATION;  Surgeon: Harold Hedge, MD;  Location: [facility];  Service: Gynecology;  Laterality: N/A;   NO PAST SURGERIES       Family History  Problem Relation Age of Onset   Diabetes Paternal Grandmother    Kidney disease Paternal Grandmother    Hypertension Mother    Kidney disease Father    Hypertension Maternal Grandfather    Anesthesia problems Neg Hx     Social History   Socioeconomic History   Marital status: Single    Spouse name: Not on file   Number of children: Not on file   Years of education: Not on file   Highest education level: Not on file  Occupational History   Not on file  Tobacco Use   Smoking status: Never   Smokeless tobacco: Never  Substance and Sexual Activity   Alcohol use: No   Drug use: No   Sexual activity: Yes    Birth control/protection: None  Other Topics Concern   Not on file  Social History Narrative   Not on file   Social Determinants of Health   Financial Resource Strain: Not on file  Food Insecurity: Not on file  Transportation Needs: Not on file  Physical Activity: Not on file  Stress: Not on file  Social Connections: Not on file  Intimate Partner Violence: Not on file    ROS Review of Systems  Constitutional:  Negative for activity change, appetite change and fever.  HENT:  Negative for rhinorrhea, sinus pain and sore  throat.   Eyes:  Negative for pain and redness.  Respiratory:  Negative for cough and shortness of breath.   Cardiovascular:  Negative for chest pain and leg swelling.  Gastrointestinal:  Negative for abdominal pain, blood in stool, constipation, diarrhea, nausea and vomiting.  Genitourinary:  Negative for difficulty urinating, dysuria, vaginal discharge and vaginal pain.  Musculoskeletal:  Negative for back pain and myalgias.  Neurological:  Negative for dizziness, light-headedness and headaches.  Psychiatric/Behavioral:  Positive for sleep disturbance. Negative for suicidal ideas. The patient is nervous/anxious.    Objective:   Today's Vitals: BP 129/89 (BP Location: Left Arm, Patient Position: Sitting, Cuff Size: Normal)    Pulse (!) 101   Temp 99 F (37.2 C) (Oral)   Ht 5' (1.524 m)   Wt 178 lb 11.2 oz (81.1 kg)   SpO2 98%   BMI 34.90 kg/m   Physical Exam Exam conducted with a chaperone present.  Constitutional:      General: She is not in acute distress.    Appearance: Normal appearance.  HENT:     Head: Normocephalic and atraumatic.  Genitourinary:    General: Normal vulva.     Exam position: Lithotomy position.     Pubic Area: No rash or pubic lice.      Labia:        Right: No rash, tenderness, lesion or injury.        Left: No rash, tenderness, lesion or injury.      Urethra: No prolapse, urethral pain, urethral swelling or urethral lesion.     Comments: Cervix: nontender, nulliparous, no discharge Vagina: no atrophy noted, no discharge, no lesions, no evidence of cystocele or rectocele  Neurological:     Mental Status: She is alert.      Assessment & Plan:   Problem List Items Addressed This Visit   None Visit Diagnoses     Severe episode of recurrent major depressive disorder, without psychotic features (HCC)    -  Primary   Relevant Medications   traZODone (DESYREL) 100 MG tablet   FLUoxetine (PROZAC) 20 MG capsule   Other Relevant Orders   Ambulatory referral to Integrated Behavioral Health   Healthcare maintenance       Relevant Orders   CBC no Diff   CMP14 + Anion Gap   Lipid Profile   Cytology -Pap Smear       Outpatient Encounter Medications as of 06/09/2021  Medication Sig   FLUoxetine (PROZAC) 20 MG capsule Take 1 capsule (20 mg total) by mouth daily for 105 doses. Take 1 capsule daily for 7 days, then 2 capsules by mouth for 7 days, then 3 capsules by mouth for 7 days.   traZODone (DESYREL) 100 MG tablet Take 100 mg by mouth at bedtime.   [DISCONTINUED] buPROPion (WELLBUTRIN XL) 150 MG 24 hr tablet Take 150 mg by mouth daily.   [DISCONTINUED] FLUoxetine (PROZAC) 40 MG capsule Take 40 mg by mouth daily.   acetaminophen (TYLENOL) 500 MG tablet Take 1,000 mg by  mouth daily as needed for mild pain or headache. For leg pain    levocetirizine (XYZAL) 5 MG tablet Take 1 tablet (5 mg total) by mouth every evening.   medroxyPROGESTERone (PROVERA) 10 MG tablet Take 1 tablet (10 mg total) by mouth daily. Use for ten days (Patient not taking: Reported on 11/14/2017)   pseudoephedrine (SUDAFED) 60 MG tablet Take 1 tablet (60 mg total) by mouth every 8 (eight) hours as needed for congestion.  sulfamethoxazole-trimethoprim (BACTRIM DS) 800-160 MG tablet sulfamethoxazole 800 mg-trimethoprim 160 mg tablet  TAKE 1 TABLET BY MOUTH EVERY 12 HOURS X 5 DAYS AS DIRECTED   [DISCONTINUED] naproxen (NAPROSYN) 500 MG tablet Take 1 tablet (500 mg total) by mouth 2 (two) times daily. (Patient not taking: Reported on 11/14/2017)   No facility-administered encounter medications on file as of 06/09/2021.    Follow-up: Return in about 2 months (around 08/10/2021) for depression follow up with me.   Patient seen with Dr. Arita Miss, DO

## 2021-06-09 NOTE — Assessment & Plan Note (Addendum)
Patient states that she was previously prescribed Wellbutrin, Prozac, and trazodone for her depression.  The trazodone does help the patient to sleep at night however she feels that the Wellbutrin and Prozac are not currently helping her thus she stopped taking them for the past few months.  Patient scored a 21 on her PHQ-9.  She has not followed with therapy but has recently been looking into this as she has a new insurance.  She states that her depression has been very bothersome to her as she has low energy and also has been overeating because of this. This also causes the patient a lot of stress. Patient denies any suicidal or homicidal ideations. Discussed referral to Dr. Sallyanne Kuster and the patient is open to this.  Also discussed restarting the patient on Prozac and titrating up to a higher dose of 60 mg.  She is open to this and is willing to give it a try.  We are discontinuing her Wellbutrin as she believes it does not help her.  Plan: - Referral to Dr. [name]  - Restart prozac: 20mg  for 1 week, 40mg  for 1 week, followed by maintenance dose of 60mg  - Follow up in September with me to discuss any changes that may need to be made

## 2021-06-09 NOTE — Assessment & Plan Note (Signed)
Patient is up to date with all of her vaccinations. Received the COVID vaccine.  Pap smear performed at this visit.   CBC, CMP, and lipid panel performed to establish baseline.

## 2021-06-13 NOTE — Addendum Note (Signed)
Addended by: Debe Coder B on: 06/13/2021 06:22 PM   Modules accepted: Level of Service

## 2021-06-13 NOTE — Progress Notes (Signed)
Internal Medicine Clinic Attending ° °I saw and evaluated the patient.  I personally confirmed the key portions of the history and exam documented by Dr. Atway and I reviewed pertinent patient test results.  The assessment, diagnosis, and plan were formulated together and I agree with the documentation in the resident’s note.  °

## 2021-06-15 ENCOUNTER — Institutional Professional Consult (permissible substitution): Payer: BC Managed Care – PPO | Admitting: Behavioral Health

## 2021-06-15 ENCOUNTER — Telehealth: Payer: Self-pay | Admitting: Behavioral Health

## 2021-06-15 LAB — CYTOLOGY - PAP
Chlamydia: NEGATIVE
Comment: NEGATIVE
Comment: NEGATIVE
Comment: NORMAL
Diagnosis: NEGATIVE
High risk HPV: NEGATIVE
Neisseria Gonorrhea: NEGATIVE

## 2021-06-15 NOTE — Telephone Encounter (Signed)
Called Pt & reached her on third try.  Intro call to determine w/Pt our goals for sessions & how scheduling works.  Pt receptive to call & requests future visits.  Dr. [name]

## 2021-07-29 ENCOUNTER — Other Ambulatory Visit: Payer: Self-pay

## 2021-07-29 ENCOUNTER — Other Ambulatory Visit: Payer: Self-pay | Admitting: Internal Medicine

## 2021-07-29 MED ORDER — FLUOXETINE HCL 20 MG PO CAPS: 60.0000 mg | ORAL_CAPSULE | Freq: Every day | ORAL | 1 refills | Status: DC

## 2021-07-29 NOTE — Telephone Encounter (Signed)
FLUoxetine (PROZAC) 20 MG capsule, REFILL REQUEST @ [facility] - [place] ([place]), [place] - [address]

## 2021-08-04 ENCOUNTER — Ambulatory Visit: Payer: BC Managed Care – PPO | Admitting: Internal Medicine

## 2021-08-04 ENCOUNTER — Encounter: Payer: Self-pay | Admitting: Internal Medicine

## 2021-08-04 VITALS — BP 127/90 | HR 84 | Temp 98.3°F | Wt 178.0 lb

## 2021-08-04 DIAGNOSIS — F332 Major depressive disorder, recurrent severe without psychotic features: Secondary | ICD-10-CM | POA: Diagnosis not present

## 2021-08-04 DIAGNOSIS — Z0000 Encounter for general adult medical examination without abnormal findings: Secondary | ICD-10-CM

## 2021-08-04 DIAGNOSIS — Z1159 Encounter for screening for other viral diseases: Secondary | ICD-10-CM

## 2021-08-04 MED ORDER — TRAZODONE HCL 100 MG PO TABS: 100.0000 mg | ORAL_TABLET | Freq: Every day | ORAL | 1 refills | Status: DC

## 2021-08-04 MED ORDER — FLUOXETINE HCL 20 MG PO CAPS: 60.0000 mg | ORAL_CAPSULE | Freq: Every day | ORAL | 1 refills | Status: DC

## 2021-08-04 NOTE — Progress Notes (Signed)
   CC: follow up depression  HPI:  Katherine Robbins is a 30 y.o. with depression and anxiety who presents to the [facility] for follow up of depression since starting Prozac. Please see problem-based list for further details, assessments, and plans.   Past Medical History:  Diagnosis Date   Blunt abdominal trauma 06/25/2012   Chlamydia    Infection    UTI   Normal pregnancy 06/25/2012   Ovarian cyst    S/P cesarean section 07/01/2012   Review of Systems:  Review of Systems  Constitutional:  Negative for chills and fever.  HENT: Negative.    Eyes: Negative.   Respiratory:  Negative for shortness of breath and wheezing.   Cardiovascular:  Negative for chest pain and palpitations.  Gastrointestinal:  Negative for diarrhea, nausea and vomiting.  Genitourinary: Negative.   Musculoskeletal: Negative.   Neurological:  Negative for dizziness and headaches.  Psychiatric/Behavioral:  Positive for depression. Negative for suicidal ideas. The patient is nervous/anxious.     Physical Exam:  Vitals:   08/04/21 0848  BP: 127/90  Pulse: 84  Temp: 98.3 F (36.8 C)  TempSrc: Oral  SpO2: 99%  Weight: 178 lb (80.7 kg)   General: No acute distress. CV: RRR. No murmurs, rubs, or gallops. No LE edema Pulmonary: Lungs CTAB. Normal effort. No wheezing or rales. Abdominal: Soft, nontender, nondistended. Normal bowel sounds. Extremities: Palpable radial and DP pulses. Normal ROM. Skin: Warm and dry. No obvious rash or lesions. Neuro: A&Ox3. Moves all extremities. Normal sensation. No focal deficit. Psych: Normal mood, somewhat flat affect   Assessment & Plan:   See Encounters Tab for problem based charting.  Patient seen with Dr. Mikey Bussing

## 2021-08-04 NOTE — Assessment & Plan Note (Addendum)
PHQ-9 improved from 21 to 16 today since starting Prozac 60mg , although the score is still high. The patient was previously referred to Dr. , but she has yet to make an appt. Patient is still willing to meet with Dr. [name] and will set up an appt to do so today. Patient notes that her energy is improved, although still not where she would like it to be. 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 Continues to deny any suicidal or homicidal ideations. Discussed the addition of Wellbutrin to help her symptoms, however, the patient has tried this medication in the past and did not like how it made her feel. Will continue with Prozac monotherapy.   Plan: - Continue prozac 60mg  daily - Referral to Dr. [name]   - Follow up in 6 months or as needed

## 2021-08-04 NOTE — Patient Instructions (Addendum)
Thank you, Ms.Janayla Carlis Stable for allowing [place] to provide your care today. Today we discussed: Depression/anxiety: I am glad you are doing better with the 60mg  of Prozac. Continue to take this medicine daily and make an appt to follow up with Dr. , our clinical psychologist.  Sleep disturbances: Refilled trazodone 100mg  Healthcare maintenance: Obtaining lab work today- will call you if any results are abnormal     I have ordered the following labs for you:  CMP Lipid Panel CBC  Hepatitis C  Tests ordered today:  None  Referrals ordered today:   Referral Orders  No referral(s) requested today     I have ordered the following medication/changed the following medications:   Stop the following medications: Medications Discontinued During This Encounter  Medication Reason   FLUoxetine (PROZAC) 20 MG capsule Dose change   FLUoxetine (PROZAC) 20 MG capsule Reorder     Start the following medications: Meds ordered this encounter  Medications   traZODone (DESYREL) 100 MG tablet    Sig: Take 1 tablet (100 mg total) by mouth at bedtime.    Dispense:  90 tablet    Refill:  1   FLUoxetine (PROZAC) 20 MG capsule    Sig: Take 3 capsules (60 mg total) by mouth daily.    Dispense:  270 capsule    Refill:  1     Follow up: 6 months    Remember: to follow up with Dr. [name]!  Should you have any questions or concerns please call the internal medicine clinic at [phone].     [name], D.O. [facility]

## 2021-08-04 NOTE — Assessment & Plan Note (Addendum)
Performing CBC, CMP, and lipid panel today that were ordered at the last visit.  Hep C test performed today   Patient declined flu shot

## 2021-08-05 LAB — CBC
Hematocrit: 44.7 % (ref 34.0–46.6)
Hemoglobin: 15.3 g/dL (ref 11.1–15.9)
MCH: 30.7 pg (ref 26.6–33.0)
MCHC: 34.2 g/dL (ref 31.5–35.7)
MCV: 90 fL (ref 79–97)
Platelets: 328 10*3/uL (ref 150–450)
RBC: 4.99 x10E6/uL (ref 3.77–5.28)
RDW: 12.9 % (ref 11.7–15.4)
WBC: 9.1 10*3/uL (ref 3.4–10.8)

## 2021-08-05 LAB — CMP14 + ANION GAP
ALT: 22 IU/L (ref 0–32)
AST: 24 IU/L (ref 0–40)
Albumin/Globulin Ratio: 1.3 (ref 1.2–2.2)
Albumin: 4.1 g/dL (ref 3.9–5.0)
Alkaline Phosphatase: 106 IU/L (ref 44–121)
Anion Gap: 15 mmol/L (ref 10.0–18.0)
BUN/Creatinine Ratio: 16 (ref 9–23)
BUN: 11 mg/dL (ref 6–20)
Bilirubin Total: <0.2 mg/dL (ref 0.0–1.2)
CO2: 21 mmol/L (ref 20–29)
Calcium: 9.6 mg/dL (ref 8.7–10.2)
Chloride: 103 mmol/L (ref 96–106)
Creatinine, Ser: 0.69 mg/dL (ref 0.57–1.00)
Globulin, Total: 3.2 g/dL (ref 1.5–4.5)
Glucose: 90 mg/dL (ref 65–99)
Potassium: 4.3 mmol/L (ref 3.5–5.2)
Sodium: 139 mmol/L (ref 134–144)
Total Protein: 7.3 g/dL (ref 6.0–8.5)
eGFR: 120 mL/min/{1.73_m2} (ref >59)

## 2021-08-05 LAB — LIPID PANEL
Chol/HDL Ratio: 3.3 ratio (ref 0.0–4.4)
Cholesterol, Total: 174 mg/dL (ref 100–199)
HDL: 53 mg/dL (ref >39)
LDL Chol Calc (NIH): 108 mg/dL — ABNORMAL HIGH (ref 0–99)
Triglycerides: 67 mg/dL (ref 0–149)
VLDL Cholesterol Cal: 13 mg/dL (ref 5–40)

## 2021-08-05 LAB — HEPATITIS C ANTIBODY: Hep C Virus Ab: <0.1 s/co ratio (ref 0.0–0.9)

## 2021-08-06 ENCOUNTER — Telehealth: Payer: Self-pay

## 2021-08-06 NOTE — Telephone Encounter (Signed)
Patient also needs refill on prozac sent in separate encounter.

## 2021-08-06 NOTE — Telephone Encounter (Signed)
Pt is requesting a call back about her test results from her last OV

## 2021-08-06 NOTE — Telephone Encounter (Signed)
Called the patient and made her aware of her test results. Refilled her prozac on 9/7. Thanks!

## 2021-11-16 ENCOUNTER — Other Ambulatory Visit: Payer: Self-pay

## 2021-11-16 ENCOUNTER — Ambulatory Visit (HOSPITAL_COMMUNITY)
Admission: EM | Admit: 2021-11-16 | Discharge: 2021-11-16 | Disposition: A | Discharge status: Home / Self Care | Payer: BC Managed Care – PPO | Attending: Urgent Care | Admitting: Urgent Care

## 2021-11-16 ENCOUNTER — Encounter (HOSPITAL_COMMUNITY): Payer: Self-pay | Admitting: Urgent Care

## 2021-11-16 DIAGNOSIS — F17210 Nicotine dependence, cigarettes, uncomplicated: Secondary | ICD-10-CM | POA: Diagnosis not present

## 2021-11-16 DIAGNOSIS — Z20822 Contact with and (suspected) exposure to covid-19: Secondary | ICD-10-CM | POA: Insufficient documentation

## 2021-11-16 DIAGNOSIS — J018 Other acute sinusitis: Secondary | ICD-10-CM | POA: Insufficient documentation

## 2021-11-16 DIAGNOSIS — Z79899 Other long term (current) drug therapy: Secondary | ICD-10-CM | POA: Diagnosis not present

## 2021-11-16 DIAGNOSIS — R051 Acute cough: Secondary | ICD-10-CM | POA: Insufficient documentation

## 2021-11-16 LAB — SARS CORONAVIRUS 2 (TAT 6-24 HRS): SARS Coronavirus 2: NEGATIVE

## 2021-11-16 MED ORDER — AMOXICILLIN-POT CLAVULANATE 875-125 MG PO TABS: 1.0000 | ORAL_TABLET | Freq: Two times a day (BID) | ORAL | 0 refills | Status: DC

## 2021-11-16 MED ORDER — PREDNISONE 20 MG PO TABS: ORAL_TABLET | ORAL | 0 refills | Status: DC

## 2021-11-16 MED ORDER — PROMETHAZINE-DM 6.25-15 MG/5ML PO SYRP: 5.0000 mL | ORAL_SOLUTION | Freq: Every evening | ORAL | 0 refills | Status: DC | PRN

## 2021-11-16 NOTE — ED Provider Notes (Signed)
Katherine Robbins [facility]   MRN: [id number] DOB: 12/12/1990  Subjective:   Katherine Robbins is a 30 y.o. female presenting for 1 week history of persistent sinus headaches, congestion now having bilateral ear pain, cough. No fever, body aches, chest pain, shob, wheezing. Patient smokes 3 cigarettes per day. No asthma.  Has been using Allegra-D consistently, Sudafed.  Has a history of allergies but this feels much worse.  No current facility-administered medications for this encounter.  Current Outpatient Medications:    acetaminophen (TYLENOL) 500 MG tablet, Take 1,000 mg by mouth daily as needed for mild pain or headache. For leg pain , Disp: , Rfl:    FLUoxetine (PROZAC) 20 MG capsule, Take 3 capsules (60 mg total) by mouth daily., Disp: 270 capsule, Rfl: 1   levocetirizine (XYZAL) 5 MG tablet, Take 1 tablet (5 mg total) by mouth every evening., Disp: 90 tablet, Rfl: 0   traZODone (DESYREL) 100 MG tablet, Take 1 tablet (100 mg total) by mouth at bedtime., Disp: 90 tablet, Rfl: 1   Allergies  Allergen Reactions   Apple Other (See Comments)    Throat and ears itch   Shellfish Allergy Itching    Throat and ear itch    Past Medical History:  Diagnosis Date   Blunt abdominal trauma 06/25/2012   Chlamydia    Infection    UTI   Normal pregnancy 06/25/2012   Ovarian cyst    S/P cesarean section 07/01/2012     Past Surgical History:  Procedure Laterality Date   CESAREAN SECTION  07/01/2012   Procedure: CESAREAN SECTION;  Surgeon: Sherron Monday, MD;  Location: [facility];  Service: Gynecology;  Laterality: N/A;  Primary cesarean section with delivery of baby boy at 44.   DILATION AND EVACUATION N/A 03/14/2017   Procedure: DILATATION AND EVACUATION;  Surgeon: Harold Hedge, MD;  Location: [facility];  Service: Gynecology;  Laterality: N/A;   NO PAST SURGERIES      Family History  Problem Relation Age of Onset   Diabetes Paternal Grandmother    Kidney disease Paternal Grandmother     Hypertension Mother    Kidney disease Father    Hypertension Maternal Grandfather    Anesthesia problems Neg Hx     Social History   Tobacco Use   Smoking status: Never   Smokeless tobacco: Never  Substance Use Topics   Alcohol use: No   Drug use: No    ROS   Objective:   Vitals: BP (!) 153/105    Pulse (!) 117    Temp 98.3 F (36.8 C)    Resp 18    SpO2 98%    Breastfeeding No   Physical Exam Constitutional:      General: She is not in acute distress.    Appearance: Normal appearance. She is well-developed. She is not ill-appearing, toxic-appearing or diaphoretic.  HENT:     Head: Normocephalic and atraumatic.     Right Ear: Tympanic membrane, ear canal and external ear normal. No drainage or tenderness. No middle ear effusion. Tympanic membrane is not erythematous.     Left Ear: Tympanic membrane, ear canal and external ear normal. No drainage or tenderness.  No middle ear effusion. Tympanic membrane is not erythematous.     Nose: Congestion and rhinorrhea present.     Comments: Purulent drainage noted intranasally with erythematous mucosa.    Mouth/Throat:     Mouth: Mucous membranes are moist. No oral lesions.     Pharynx:  No pharyngeal swelling, oropharyngeal exudate, posterior oropharyngeal erythema or uvula swelling.     Tonsils: No tonsillar exudate or tonsillar abscesses.  Eyes:     General: No scleral icterus.       Right eye: No discharge.        Left eye: No discharge.     Extraocular Movements: Extraocular movements intact.     Right eye: Normal extraocular motion.     Left eye: Normal extraocular motion.     Conjunctiva/sclera: Conjunctivae normal.     Pupils: Pupils are equal, round, and reactive to light.  Cardiovascular:     Rate and Rhythm: Normal rate and regular rhythm.     Pulses: Normal pulses.     Heart sounds: Normal heart sounds. No murmur heard.   No friction rub. No gallop.  Pulmonary:     Effort: Pulmonary effort is normal. No  respiratory distress.     Breath sounds: Normal breath sounds. No stridor. No wheezing, rhonchi or rales.  Musculoskeletal:     Cervical back: Normal range of motion and neck supple.  Lymphadenopathy:     Cervical: No cervical adenopathy.  Skin:    General: Skin is warm and dry.     Findings: No rash.  Neurological:     General: No focal deficit present.     Mental Status: She is alert and oriented to person, place, and time.     Cranial Nerves: No cranial nerve deficit.     Motor: No weakness.     Coordination: Coordination normal.     Gait: Gait normal.  Psychiatric:        Mood and Affect: Mood normal.        Behavior: Behavior normal.        Thought Content: Thought content normal.        Judgment: Judgment normal.    Assessment and Plan :   PDMP not reviewed this encounter.  1. Acute non-recurrent sinusitis of other sinus   2. Acute cough    Will start empiric treatment for sinusitis with Augmentin.  Recommended supportive care otherwise including the use of oral antihistamine, prednisone for her allergic rhinitis. Deferred imaging given clear cardiopulmonary exam, hemodynamically stable vital signs.  Advised against using double pseudoephedrine as I suspect that this is the source of her elevated pulse.  COVID-19 testing pending.  Counseled patient on potential for adverse effects with medications prescribed/recommended today, ER and return-to-clinic precautions discussed, patient verbalized understanding.     Katherine Robbins, [place] 11/16/21 [phone]

## 2021-11-16 NOTE — ED Triage Notes (Signed)
Pt is present today with HA, cough, sinus congestion. Pt states sx started one week ago.

## 2021-12-09 ENCOUNTER — Encounter: Payer: Self-pay | Admitting: Student

## 2021-12-09 ENCOUNTER — Other Ambulatory Visit: Payer: Self-pay

## 2021-12-09 ENCOUNTER — Ambulatory Visit
Admission: RE | Admit: 2021-12-09 | Discharge: 2021-12-09 | Disposition: A | Discharge status: Home / Self Care | Payer: BC Managed Care – PPO | Source: Ambulatory Visit | Attending: Internal Medicine | Admitting: Internal Medicine

## 2021-12-09 ENCOUNTER — Ambulatory Visit: Payer: BC Managed Care – PPO | Admitting: Internal Medicine

## 2021-12-09 VITALS — BP 127/96 | HR 98 | Temp 98.1°F | Ht 60.0 in | Wt 179.4 lb

## 2021-12-09 DIAGNOSIS — N6322 Unspecified lump in the left breast, upper inner quadrant: Secondary | ICD-10-CM

## 2021-12-09 DIAGNOSIS — F332 Major depressive disorder, recurrent severe without psychotic features: Secondary | ICD-10-CM | POA: Diagnosis not present

## 2021-12-09 DIAGNOSIS — N63 Unspecified lump in unspecified breast: Secondary | ICD-10-CM | POA: Diagnosis not present

## 2021-12-09 NOTE — Patient Instructions (Signed)
I have ordered a left breast ultrasound and bilateral mammogram.  I will call you as soon as we are able to get this scheduled for you.

## 2021-12-09 NOTE — Assessment & Plan Note (Signed)
PHQ-9 10.  On fluoxetine 60 mg daily, patient continues to have feelings of depression although improved.  States that she would like to proceed with therapy.  We will send another referral to Dr. Carolynne Edouard.  -Continue fluoxetine 60 mg daily -Referred to Dr. Carolynne Edouard

## 2021-12-09 NOTE — Assessment & Plan Note (Addendum)
Patient presents for evaluation of left breast mass that she noticed about 4 weeks ago. She was mentruating at that time she noticed the mass in her left upper breast.  The mass did not subside after she stopped menstruating.  Patient denies any previous lumps or bumps that she noticed in her breast.  Denies any family history of breast cancer in her immediate family; states some of her mothers cousins may have breast cancer.  Denies any fevers, chills, weight loss.  No abnormal discharge from her nipples.  She has 1 child status post C-section in 2013.  She states both of her breasts are tender due to current menstruation.  Reports that her mass is tender when she sits up.  On exam, there is a 3 cm x 3 cm palpable firm mass in the upper inner quadrant of the left breast near the 11 o'clock position.  No overlying skin changes noted.  On the right breast lower inner portion there is a small erythematous skin lesion.  Nontender to palpation.  No cervical or axillary lymphadenopathy.  Assessment/plan: Cannot exclude underlying malignancy.  Fibroadenoma less likely given the mass has not subsided in between cycles.  Ordered a stat bilateral diagnostic mammogram and stat left breast ultrasound.

## 2021-12-09 NOTE — Progress Notes (Signed)
° °  CC: breast lump  HPI:  [name] is a 31 y.o.   Past Medical History:  Diagnosis Date   Blunt abdominal trauma 06/25/2012   Chlamydia    Infection    UTI   Normal pregnancy 06/25/2012   Ovarian cyst    S/P cesarean section 07/01/2012   Review of Systems:   Review of Systems  Constitutional:  Negative for chills, fever and weight loss.  Gastrointestinal:  Negative for nausea and vomiting.  Psychiatric/Behavioral:  Positive for depression. The patient is nervous/anxious.     Physical Exam:  Vitals:   12/09/21 0920  BP: (!) 127/96  Pulse: 98  Temp: 98.1 F (36.7 C)  TempSrc: Oral  SpO2: 97%  Weight: 179 lb 6.4 oz (81.4 kg)  Height: 5' (1.524 m)   Physical Exam General: alert, appears stated age, in no acute distress HEENT: Normocephalic, atraumatic, EOM intact, conjunctiva normal CV: Regular rate and rhythm, no murmurs rubs or gallops Pulm: Clear to auscultation bilaterally, normal work of breathing Abdomen: Soft, nondistended, bowel sounds present, no tenderness to palpation MSK: No lower extremity edema Skin: Warm and dry, small 0.5 cm skin lesion on lower inner right breast, erythematous, non tender Neuro: Alert and oriented x3  Breast: bilateral breast tenderness, ~3 cm x 3 cm palpable firm breast mass along the upper inner left breast, approximately 11 o'clock position.   Assessment & Plan:   See Encounters Tab for problem based charting.  Patient seen with Dr.  Saverio Danker

## 2021-12-10 NOTE — Progress Notes (Signed)
Internal Medicine Clinic Attending  I saw and evaluated the patient.  I personally confirmed the key portions of the history and exam documented by Dr.  Rehman  and I reviewed pertinent patient test results.  The assessment, diagnosis, and plan were formulated together and I agree with the documentation in the resident's note.  

## 2021-12-16 ENCOUNTER — Encounter: Payer: Self-pay | Admitting: Internal Medicine

## 2022-01-05 ENCOUNTER — Institutional Professional Consult (permissible substitution): Payer: BC Managed Care – PPO | Admitting: Behavioral Health

## 2022-01-05 ENCOUNTER — Telehealth: Payer: Self-pay | Admitting: Behavioral Health

## 2022-01-05 NOTE — Telephone Encounter (Signed)
Lft msg for Pt on first attempt to call & r/s if this is necessary. Spoke w/Pt on 2nd attempt. She has already r/s'd; she is @ work & did not remember the telehealth call today. Pt r/s'd for 01/26/2022.  Dr. [name]

## 2022-01-26 ENCOUNTER — Institutional Professional Consult (permissible substitution): Payer: BC Managed Care – PPO | Admitting: Behavioral Health

## 2022-02-03 DIAGNOSIS — R059 Cough, unspecified: Secondary | ICD-10-CM | POA: Diagnosis not present

## 2022-02-03 DIAGNOSIS — J3489 Other specified disorders of nose and nasal sinuses: Secondary | ICD-10-CM | POA: Diagnosis not present

## 2022-02-21 ENCOUNTER — Ambulatory Visit: Payer: BC Managed Care – PPO | Admitting: Behavioral Health

## 2022-02-21 DIAGNOSIS — F331 Major depressive disorder, recurrent, moderate: Secondary | ICD-10-CM

## 2022-02-21 DIAGNOSIS — F419 Anxiety disorder, unspecified: Secondary | ICD-10-CM

## 2022-02-21 NOTE — BH Specialist Note (Signed)
Integrated Behavioral Health via Telemedicine Visit ? ?02/21/2022 ?Katherine Robbins ?[id number] ? ?Number of Integrated Behavioral Health Clinician visits: 2 ?Session Start time: 1045 ?Session End time: 1100 ?Total time in minutes: 15 min ? ?Referring Provider: Dr. Jamison Oka, DO ?Patient/Family location: Pt is travelling in her car to work ?[facility] Provider location: [facility] Office ?All persons participating in visit: Pt & Clinician ?Types of Service: Individual psychotherapy ? ?I connected with Katherine Robbins and/or Jola Babinski Porrata's  self  via  Telephone or Video Enabled Telemedicine Application  (Video is Caregility application) and verified that I am speaking with the correct person using two identifiers. Discussed confidentiality: Yes  ? ?I discussed the limitations of telemedicine and the availability of in person appointments.  Discussed there is a possibility of technology failure and discussed alternative modes of communication if that failure occurs. ? ?I discussed that engaging in this telemedicine visit, they consent to the provision of behavioral healthcare and the services will be billed under their insurance. ? ?Patient and/or legal guardian expressed understanding and consented to Telemedicine visit: Yes  ? ?Presenting Concerns: ?Patient and/or family reports the following symptoms/concerns: elevated anx/dep this past Sunday; Pt reports she stayed in bed, in the dark all day. She admits change is difficult for her & the situation w/Employees has changed recently. The person she relies on for many essential duties has gotten a second job moving from 5d/wk down to 2d/wk. This reduction in this Empl's hrs has impacted Pt. She, "takes less off my plate now". Pt is having a difficult time adjusting to this new schedule.  ?Duration of problem: a few wks w/acute rxn to her Empl's dec in job hour availability; Severity of problem: moderate trending severe ? ?Patient and/or Family's Strengths/Protective  Factors: ?Social connections, Concrete supports in place (healthy food, safe environments, etc.), Sense of purpose, and Physical Health (exercise, healthy diet, medication compliance, etc.) ? ?Goals Addressed: ?Patient will: ? Reduce symptoms of: anxiety, depression, and stress  ? Increase knowledge and/or ability of: coping skills, healthy habits, self-management skills, and stress reduction  ? Demonstrate ability to: Increase healthy adjustment to current life circumstances ? ?Progress towards Goals: ?Ongoing, although Pt has missed a call on 01/05/22 & her first Intro/Eval call was also short like today. Pt encouraged to be consistent w/calls as she has expectations we work on her self-confidence & esteem & also her tendency to self-doubt due to levels of debilitating anx recently.  ? ?Encouraged Pt in her self-belief & to not cut her own self-care short by limiting her psychotherapy sessions. ? ?Interventions: ?Interventions utilized:  Motivational Interviewing and Supportive Counseling ?Standardized Assessments completed:  screeners prn ? ?Patient and/or Family Response: Secured Pt contact on second call. Pt able to articulate needs adequately & shared her Sunday exp due to work stressors. Pt anticipates a future IBH telehealth session in 2-3 wks. Will check w/Pt to determine if wkly calls would serve her well. Time ran out today to fully get Pt feedback. ? ?Assessment: ?Patient currently experiencing elevated anx/dep & sadness due to her work changes. 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 With the recent changes in Employee availability, Pt is having hardship in adjusting to the circumstances. She describes psychological overwhelm that indicates she may need to have a review of her psychopharmacologicals. She is taking Prozac 20mg  TID. She does not feel it is doing her much good, esp'ly after her recent events on Sunday. Emphasized to Pt the need  to schedule a f/u visit w/her Provider to  provide feedback. ? ?Patient may benefit from cont'd Cslg sessions, whether @ [facility] w/Clinician or another format that works best/better for her. . ? ?Plan: ?Follow up with behavioral health clinician on : 2-3 wks on IBH telehealth for 30 min ck-in ?Behavioral recommendations: Cont psychotherapy w/Pt specifics; "work on self-love, self-esteem, & self-doubt issues". Pt agreed to send a MyChart msg to Clinician w/her thoughts. ?Referral(s): Integrated [employer] (In Clinic) and Counselor in the Community prn.  ? ?I discussed the assessment and treatment plan with the patient and/or parent/guardian. They were provided an opportunity to ask questions and all were answered. They agreed with the plan and demonstrated an understanding of the instructions. ?  ?They were advised to call back or seek an in-person evaluation if the symptoms worsen or if the condition fails to improve as anticipated. ? ?Deneise Lever, LMFT ?

## 2022-03-15 DIAGNOSIS — N390 Urinary tract infection, site not specified: Secondary | ICD-10-CM | POA: Diagnosis not present

## 2022-03-15 DIAGNOSIS — Z113 Encounter for screening for infections with a predominantly sexual mode of transmission: Secondary | ICD-10-CM | POA: Diagnosis not present

## 2022-03-21 ENCOUNTER — Telehealth: Payer: Self-pay | Admitting: Behavioral Health

## 2022-03-21 ENCOUNTER — Institutional Professional Consult (permissible substitution): Payer: BC Managed Care – PPO | Admitting: Behavioral Health

## 2022-03-21 NOTE — Telephone Encounter (Signed)
Lft msgs for Pt to call & r/s appt time @ her convenience. ? ?Dr. [name] ?

## 2022-06-01 ENCOUNTER — Encounter: Payer: Self-pay | Admitting: Internal Medicine

## 2022-06-01 ENCOUNTER — Ambulatory Visit (INDEPENDENT_AMBULATORY_CARE_PROVIDER_SITE_OTHER): Payer: BC Managed Care – PPO | Admitting: Internal Medicine

## 2022-06-01 VITALS — BP 144/108 | HR 84 | Temp 98.7°F | Ht 60.0 in | Wt 179.0 lb

## 2022-06-01 DIAGNOSIS — F331 Major depressive disorder, recurrent, moderate: Secondary | ICD-10-CM

## 2022-06-01 DIAGNOSIS — Z6834 Body mass index (BMI) 34.0-34.9, adult: Secondary | ICD-10-CM | POA: Diagnosis not present

## 2022-06-01 DIAGNOSIS — E669 Obesity, unspecified: Secondary | ICD-10-CM | POA: Diagnosis not present

## 2022-06-01 DIAGNOSIS — R5383 Other fatigue: Secondary | ICD-10-CM | POA: Diagnosis not present

## 2022-06-01 DIAGNOSIS — I10 Essential (primary) hypertension: Secondary | ICD-10-CM | POA: Diagnosis not present

## 2022-06-01 DIAGNOSIS — F329 Major depressive disorder, single episode, unspecified: Secondary | ICD-10-CM | POA: Diagnosis not present

## 2022-06-01 MED ORDER — BUPROPION HCL ER (XL) 150 MG PO TB24: 150.0000 mg | ORAL_TABLET | Freq: Every day | ORAL | 2 refills | Status: DC

## 2022-06-01 NOTE — Progress Notes (Signed)
Subjective:  CC: struggling to lose weight  HPI:  [name] is a 31 y.o. female with a past medical history stated below and presents today for difficulty losing weight. Please see problem based assessment and plan for additional details.  Past Medical History: Obesity MDD Hypertension   No current outpatient medications on file prior to visit.   No current facility-administered medications on file prior to visit.    Family History  Problem Relation Age of Onset   Diabetes Paternal Grandmother    Kidney disease Paternal Grandmother    Hypertension Mother    Kidney disease Father    Hypertension Maternal Grandfather    Anesthesia problems Neg Hx     Social History   Socioeconomic History   Marital status: Single    Spouse name: Not on file   Number of children: Not on file   Years of education: Not on file   Highest education level: Not on file  Occupational History   Not on file  Tobacco Use   Smoking status: Never   Smokeless tobacco: Never  Substance and Sexual Activity   Alcohol use: No   Drug use: No   Sexual activity: Yes    Birth control/protection: None  Other Topics Concern   Not on file  Social History Narrative   Not on file   Social Determinants of Health   Financial Resource Strain: Not on file  Food Insecurity: Not on file  Transportation Needs: Not on file  Physical Activity: Not on file  Stress: Not on file  Social Connections: Not on file  Intimate Partner Violence: Not on file    Review of Systems: ROS negative except for what is noted on the assessment and plan.  Objective:   Vitals:   06/01/22 1353 06/01/22 1420  BP: (!) 139/100 (!) 144/108  Pulse: 91 84  Temp: 98.7 F (37.1 C)   TempSrc: Oral   SpO2: 98%   Weight: 179 lb (81.2 kg)   Height: 5' (1.524 m)     Physical Exam: Constitutional: well-appearing, in no acute distress Cardiovascular: regular rate and rhythm, no m/r/g Pulmonary/Chest: normal  work of breathing on room air, lungs clear to auscultation bilaterally Abdominal: soft, non-tender, non-distended MSK: normal bulk and tone Neurological: alert & oriented x 3, 5/5 strength in bilateral upper and lower extremities, normal gait Skin: warm and dry Psych: intermittently tearful     Assessment & Plan:  Major depression Patient presents with history of depression.  PHQ-9 elevated at 23 today.  She states that she is having difficulty with fatigue requiring her to drink 4-5 energy drinks daily to function at work.  She has been trying to exercise and diet.  She recently got a membership to [employer], but struggles to have motivation to go there multiple times per week.  On exam she is tearful and states that she does not want to come "life "sometimes".  She denies suicidal ideation or homicidal ideation.  She does talk about these feelings with her mom who encouraged her to come to appointment.  She has previously tried fluoxetine and did not find this medication helpful.  She has difficulty staying asleep and wakes up frequently throughout the night.  A/P: Depression appears to have worsened.  She has not been taking fluoxetine.  She states that telehealth appointment with Dr. Sallyanne Kuster, psychiatrist, was helpful but she would prefer someone that was in person. - Start Wellbutrin 150 mg grams daily - Patient was  given information for [facility] including phone number and instructed to call them to set up counseling -   Follow-up by telehealth in 1 week  Obesity (BMI 30-39.9) Patient presents with BMI of 34.9.  She has been changing her diet and exercising more to try to lose weight.  She does not eat breakfast or lunch, and waits until after work to eat.  She states that this is because eating at work seems to make her sleepy even when she eats something as small as a yogurt.  She drinks about 4-5 red bull energy drinks daily.  In the past she has eaten food in response to sad emotions.   She is trying to unlearn this habit, but states that she does this at times after hard at work. A/P: I think that her diet and exercise efforts will be improved if her depression is adequately treated.  Today we are starting Wellbutrin with plans for follow-up next week.  If she continues to have fatigue, would consider testing for for hypothyroidism.  Hypertension Patient presenting with obesity and hypertension.  Blood pressure is elevated at 139/100 and increased to 144/108 when rechecked.  She does not check her blood pressure at home.  On chart review her blood pressures have been elevated at last 2 encounters. A/P: I asked patient to blood pressure cuff and check blood pressure once daily.  We will review at next week's telehealth appointment.  If consistently elevated at home will consider starting medication, likely with amlodipine.   Patient discussed with Dr. Marjorie Smolder Katherine Robbins, D.O. [facility]  PGY-2 Pager: [phone]  Phone: [phone] Date 06/02/2022  Time 6:20 AM

## 2022-06-01 NOTE — Patient Instructions (Addendum)
Thank you, [name] Stable for allowing [place] to provide your care today. Today we discussed:  Depression: I have sent in a medication called Wellbutrin. Take this once daily. It will take some time for this to work and we will have to work together to increase the dose over time to a level that is helpful for you. Please call [facility] at [phone] to set up counseling. I would like to do a tele-health visit with you next week to check in.  Obesity: I think that once we get your depression under better control, it will be easier to lose weight. We can also consider checking thyroid levels in the future if you continue to have problems losing weight.  Blood pressure: your blood pressure was elevated today. Goal for your age is < 130/80. Please check your blood pressure once a day over the next week and we can discuss at tele-health visit. Please try to decrease amount of caffeine that your are drinking.     I have ordered the following labs for you:  Lab Orders  No laboratory test(s) ordered today     Referrals ordered today:   Referral Orders  No referral(s) requested today     I have ordered the following medication/changed the following medications:   Stop the following medications: Medications Discontinued During This Encounter  Medication Reason   predniSONE (DELTASONE) 20 MG tablet Completed Course   amoxicillin-clavulanate (AUGMENTIN) 875-125 MG tablet Completed Course   promethazine-dextromethorphan (PROMETHAZINE-DM) 6.25-15 MG/5ML syrup Discontinued by provider   traZODone (DESYREL) 100 MG tablet Discontinued by provider   FLUoxetine (PROZAC) 20 MG capsule Discontinued by provider   levocetirizine (XYZAL) 5 MG tablet Discontinued by provider   acetaminophen (TYLENOL) 500 MG tablet      Start the following medications: No orders of the defined types were placed in this encounter.    Follow up: 1 week by tele-visit   Remember: call [facility] to set up counseling at  [phone]  We look forward to seeing you next time. Please call our clinic at [phone] if you have any questions or concerns. The best time to call is Monday-Friday from 9am-4pm, but there is someone available 24/7. If after hours or the weekend, call the main hospital number and ask for the Internal Medicine Resident On-Call. If you need medication refills, please notify your pharmacy one week in advance and they will send [place] a request.   Thank you for trusting me with your care. Wishing you the best!   Rudene Christians, DO [facility]

## 2022-06-02 DIAGNOSIS — E669 Obesity, unspecified: Secondary | ICD-10-CM | POA: Insufficient documentation

## 2022-06-02 DIAGNOSIS — I10 Essential (primary) hypertension: Secondary | ICD-10-CM | POA: Insufficient documentation

## 2022-06-02 NOTE — Assessment & Plan Note (Signed)
Patient presenting with obesity and hypertension.  Blood pressure is elevated at 139/100 and increased to 144/108 when rechecked.  She does not check her blood pressure at home.  On chart review her blood pressures have been elevated at last 2 encounters. A/P: I asked patient to blood pressure cuff and check blood pressure once daily.  We will review at next week's telehealth appointment.  If consistently elevated at home will consider starting medication, likely with amlodipine.

## 2022-06-02 NOTE — Assessment & Plan Note (Signed)
Patient presents with history of depression.  PHQ-9 elevated at 23 today.  She states that she is having difficulty with fatigue requiring her to drink 4-5 energy drinks daily to function at work.  She has been trying to exercise and diet.  She recently got a membership to [employer], but struggles to have motivation to go there multiple times per week.  On exam she is tearful and states that she does not want to come "life "sometimes".  She denies suicidal ideation or homicidal ideation.  She does talk about these feelings with her mom who encouraged her to come to appointment.  She has previously tried fluoxetine and did not find this medication helpful.  She has difficulty staying asleep and wakes up frequently throughout the night.  A/P: Depression appears to have worsened.  She has not been taking fluoxetine.  She states that telehealth appointment with Dr. Sallyanne Kuster, psychiatrist, was helpful but she would prefer someone that was in person. - Start Wellbutrin 150 mg grams daily - Patient was given information for [facility] including phone number and instructed to call them to set up counseling -   Follow-up by telehealth in 1 week

## 2022-06-02 NOTE — Assessment & Plan Note (Addendum)
Patient presents with BMI of 34.9.  She has been changing her diet and exercising more to try to lose weight.  She does not eat breakfast or lunch, and waits until after work to eat.  She states that this is because eating at work seems to make her sleepy even when she eats something as small as a yogurt.  She drinks about 4-5 red bull energy drinks daily.  In the past she has eaten food in response to sad emotions.  She is trying to unlearn this habit, but states that she does this at times after hard at work. A/P: I think that her diet and exercise efforts will be improved if her depression is adequately treated.  Today we are starting Wellbutrin with plans for follow-up next week.  If she continues to have fatigue, would consider testing for for hypothyroidism. Could also consider testing for diabetes.

## 2022-06-10 ENCOUNTER — Telehealth: Payer: Self-pay

## 2022-06-10 ENCOUNTER — Ambulatory Visit (INDEPENDENT_AMBULATORY_CARE_PROVIDER_SITE_OTHER): Payer: BC Managed Care – PPO | Admitting: Internal Medicine

## 2022-06-10 DIAGNOSIS — F331 Major depressive disorder, recurrent, moderate: Secondary | ICD-10-CM | POA: Diagnosis not present

## 2022-06-10 MED ORDER — BUPROPION HCL ER (XL) 300 MG PO TB24
300.0000 mg | ORAL_TABLET | ORAL | 2 refills | Status: DC
Start: 2022-06-10 — End: 2022-09-27

## 2022-06-10 NOTE — Telephone Encounter (Signed)
Call from patient about increase in Wellbutrin dosing... Spoke with Dr.   Sloan Leiter patient was to have a Tele Health visit to discuss progress in condition.  Patient scheduled for a TeleHealth appointment with Dr. Sloan Leiter this afternoon.  Patient was informed of the [facility] appointment for this afternoon.

## 2022-06-10 NOTE — Telephone Encounter (Signed)
Requesting to speak with a nurse about dosage increase on buPROPion (WELLBUTRIN XL) 150 MG 24 hr tablet. Please call pt back.

## 2022-06-10 NOTE — Progress Notes (Addendum)
   I connected with  Katherine Robbins on 06/20/22 by telephone and verified that I am speaking with the correct person using two identifiers.   I discussed the limitations of evaluation and management by telemedicine. The patient expressed understanding and agreed to proceed.  CC: depression  This is a telephone encounter between Katherine Robbins and Katherine Robbins on 06/20/2022 for depression. The visit was conducted with the patient located at home and Katherine Robbins at [facility]. The patient's identity was confirmed using their DOB and current address. The patient has consented to being evaluated through a telephone encounter and understands the associated risks (an examination cannot be done and the patient may need to come in for an appointment) / benefits (allows the patient to remain at home, decreasing exposure to coronavirus). I personally spent 10 minutes on medical discussion.   HPI:  Katherine Robbins is a 31 y.o. with PMH as below.   Please see A&P for assessment of the patient's acute and chronic medical conditions.   Past Medical History:  Diagnosis Date   Blunt abdominal trauma 06/25/2012   Chlamydia    Infection    UTI   Normal pregnancy 06/25/2012   Normal pregnancy 06/25/2012   Ovarian cyst    S/P cesarean section 07/01/2012   S/P cesarean section 07/01/2012   Review of Systems:  fatigue, weight gain, depressed mood  Assessment & Plan:   Major depression Katherine Robbins is a 30 year old with history of depression.  This has been Going for several years and she has previously taken Prozac.  I saw her earlier this month and Wellbutrin was started at that time.  She states that since then she has not noticed a significant difference in her symptoms.  She endorses difficulty sleeping and fatigue.  She drinks 3-4 energy drinks a day and is trying to decrease amount. A/P: In the setting of her fatigue, I will further evaluate for possible thyroid dysfunction or anemia.  I talked with  patient about coming in for labs only visit. -TSH, CBC -Increase Wellbutrin from 150 to 300 mg  Addendum 7/24: TSH and CBC within normal limits.  I called and updated patient.    Patient discussed with Dr. Josetta Huddle Derreon Consalvo, D.O. [facility] Internal Medicine  PGY-1 Pager: [phone]  Phone: [phone] Date 06/20/2022  Time 9:58 AM

## 2022-06-10 NOTE — Assessment & Plan Note (Addendum)
Katherine Robbins is a 31 year old with history of depression.  This has been Going for several years and she has previously taken Prozac.  I saw her earlier this month and Wellbutrin was started at that time.  She states that since then she has not noticed a significant difference in her symptoms.  She endorses difficulty sleeping and fatigue.  She drinks 3-4 energy drinks a day and is trying to decrease amount. A/P: In the setting of her fatigue, I will further evaluate for possible thyroid dysfunction or anemia.  I talked with patient about coming in for labs only visit. -TSH, CBC -Increase Wellbutrin from 150 to 300 mg  Addendum 7/24: TSH and CBC within normal limits.  I called and updated patient.

## 2022-06-14 NOTE — Progress Notes (Signed)
Internal Medicine Clinic Attending  Case discussed with Dr. Masters  At the time of the visit.  We reviewed the resident's history and exam and pertinent patient test results.  I agree with the assessment, diagnosis, and plan of care documented in the resident's note.  

## 2022-06-17 ENCOUNTER — Other Ambulatory Visit (INDEPENDENT_AMBULATORY_CARE_PROVIDER_SITE_OTHER): Payer: BC Managed Care – PPO

## 2022-06-17 DIAGNOSIS — F331 Major depressive disorder, recurrent, moderate: Secondary | ICD-10-CM

## 2022-06-18 LAB — CBC
Hematocrit: 46 % (ref 34.0–46.6)
Hemoglobin: 15.8 g/dL (ref 11.1–15.9)
MCH: 30.9 pg (ref 26.6–33.0)
MCHC: 34.3 g/dL (ref 31.5–35.7)
MCV: 90 fL (ref 79–97)
Platelets: 262 10*3/uL (ref 150–450)
RBC: 5.12 x10E6/uL (ref 3.77–5.28)
RDW: 12.9 % (ref 11.7–15.4)
WBC: 10 10*3/uL (ref 3.4–10.8)

## 2022-06-18 LAB — TSH: TSH: 0.768 u[IU]/mL (ref 0.450–4.500)

## 2022-06-18 NOTE — Progress Notes (Signed)
Internal Medicine Clinic Attending  Case discussed with Dr. Masters  At the time of the visit.  We reviewed the resident's history and exam and pertinent patient test results.  I agree with the assessment, diagnosis, and plan of care documented in the resident's note.  

## 2022-06-25 ENCOUNTER — Encounter (HOSPITAL_COMMUNITY): Payer: Self-pay | Admitting: Emergency Medicine

## 2022-06-25 ENCOUNTER — Other Ambulatory Visit: Payer: Self-pay

## 2022-06-25 ENCOUNTER — Ambulatory Visit (HOSPITAL_COMMUNITY)
Admission: EM | Admit: 2022-06-25 | Discharge: 2022-06-25 | Disposition: A | Discharge status: Home / Self Care | Payer: BC Managed Care – PPO | Attending: Emergency Medicine | Admitting: Emergency Medicine

## 2022-06-25 DIAGNOSIS — B3731 Acute candidiasis of vulva and vagina: Secondary | ICD-10-CM | POA: Insufficient documentation

## 2022-06-25 DIAGNOSIS — N760 Acute vaginitis: Secondary | ICD-10-CM | POA: Diagnosis not present

## 2022-06-25 DIAGNOSIS — N898 Other specified noninflammatory disorders of vagina: Secondary | ICD-10-CM | POA: Diagnosis not present

## 2022-06-25 LAB — POC URINE PREG, ED: Preg Test, Ur: NEGATIVE

## 2022-06-25 NOTE — Discharge Instructions (Addendum)
We will call you if anything returns positive on your swab. Please abstain from sexual intercourse until your results return.

## 2022-06-25 NOTE — ED Triage Notes (Signed)
Vaginal discharge for one -two weeks.  Lower abdominal cramping during this time frame.  Denies urinary symptoms

## 2022-06-25 NOTE — ED Provider Notes (Signed)
[facility]    CSN: [id number] Arrival date & time: 06/25/22  1003     History   Chief Complaint Chief Complaint  Patient presents with   SEXUALLY TRANSMITTED DISEASE    HPI Katherine Robbins is a 31 y.o. female.  Presents with 2-week history of increased vaginal discharge.  Appears white. Additionally reports some lower abdominal cramping that feels like her menstrual cycle cramps. Non severe. No n/v/d. Denies any known exposure to STD, denies possibility of pregnancy. No urinary symptoms.  No fevers, back pain, flank pain. Requests STD testing  History of chlamydia, trichomonas, ovarian cyst Reports this does not feel like an STD  Past Medical History:  Diagnosis Date   Blunt abdominal trauma 06/25/2012   Chlamydia    Infection    UTI   Normal pregnancy 06/25/2012   Normal pregnancy 06/25/2012   Ovarian cyst    S/P cesarean section 07/01/2012   S/P cesarean section 07/01/2012    Patient Active Problem List   Diagnosis Date Noted   Obesity (BMI 30-39.9) 06/02/2022   Hypertension 06/02/2022   Breast lump in upper inner quadrant 12/09/2021   Healthcare maintenance 06/09/2021   Major depression 06/09/2021    Past Surgical History:  Procedure Laterality Date   CESAREAN SECTION  07/01/2012   Procedure: CESAREAN SECTION;  Surgeon: Sherron Monday, MD;  Location: [facility];  Service: Gynecology;  Laterality: N/A;  Primary cesarean section with delivery of baby boy at 68.   DILATION AND EVACUATION N/A 03/14/2017   Procedure: DILATATION AND EVACUATION;  Surgeon: Harold Hedge, MD;  Location: [facility];  Service: Gynecology;  Laterality: N/A;   NO PAST SURGERIES      OB History     Gravida  3   Para  1   Term  1   Preterm  0   AB  2   Living  1      SAB  2   IAB  0   Ectopic  0   Multiple  0   Live Births  1            Home Medications    Prior to Admission medications   Medication Sig Start Date End Date Taking? Authorizing Provider   buPROPion (WELLBUTRIN XL) 300 MG 24 hr tablet Take 1 tablet (300 mg total) by mouth every morning. 06/10/22 06/10/23  Masters, Florentina Addison, DO    Family History Family History  Problem Relation Age of Onset   Hypertension Mother    Kidney disease Father    Hypertension Maternal Grandfather    Diabetes Paternal Grandmother    Kidney disease Paternal Grandmother    Anesthesia problems Neg Hx     Social History Social History   Tobacco Use   Smoking status: Every Day    Types: Cigarettes   Smokeless tobacco: Never  Vaping Use   Vaping Use: Every day  Substance Use Topics   Alcohol use: Yes   Drug use: No     Allergies   Apple juice and Shellfish allergy   Review of Systems Review of Systems Per HPI  Physical Exam Triage Vital Signs ED Triage Vitals  Enc Vitals Group     BP 06/25/22 1019 (!) 139/98     Pulse Rate 06/25/22 1019 92     Resp 06/25/22 1019 18     Temp 06/25/22 1019 99.5 F (37.5 C)     Temp Source 06/25/22 1019 Oral     SpO2 06/25/22 1019 97 %  Weight --      Height --      Head Circumference --      Peak Flow --      Pain Score 06/25/22 1017 5     Pain Loc --      Pain Edu? --      Excl. in GC? --    No data found.  Updated Vital Signs BP (!) 139/98 (BP Location: Right Arm)   Pulse 92   Temp 99.5 F (37.5 C) (Oral)   Resp 18   LMP 06/03/2022   SpO2 97%     Physical Exam Vitals and nursing note reviewed.  Constitutional:      General: She is not in acute distress.    Appearance: Normal appearance.  HENT:     Mouth/Throat:     Pharynx: Oropharynx is clear.  Cardiovascular:     Rate and Rhythm: Normal rate and regular rhythm.     Heart sounds: Normal heart sounds.  Pulmonary:     Effort: Pulmonary effort is normal.     Breath sounds: Normal breath sounds.  Abdominal:     Tenderness: There is no abdominal tenderness. There is no right CVA tenderness or left CVA tenderness.  Neurological:     Mental Status: She is alert and  oriented to person, place, and time.     UC Treatments / Results  Labs (all labs ordered are listed, but only abnormal results are displayed) Labs Reviewed  POC URINE PREG, ED  CERVICOVAGINAL ANCILLARY ONLY    EKG  Radiology No results found.  Procedures Procedures  Medications Ordered in UC Medications - No data to display  Initial Impression / Assessment and Plan / UC Course  I have reviewed the triage vital signs and the nursing notes.  Pertinent labs & imaging results that were available during my care of the patient were reviewed by me and considered in my medical decision making (see chart for details).  Urine pregnancy negative. Cytology swab pending.  Will treat for positive result. STD pamphlet and condom baggie given for patient education. Return precautions discussed. Patient agrees to plan and is discharged in stable condition.  Final Clinical Impressions(s) / UC Diagnoses   Final diagnoses:  Vaginal discharge     Discharge Instructions      We will call you if anything returns positive on your swab. Please abstain from sexual intercourse until your results return.     ED Prescriptions   None    PDMP not reviewed this encounter.   Kasey Ewings, Lurena Joiner, [place] 06/25/22 1059

## 2022-06-27 LAB — CERVICOVAGINAL ANCILLARY ONLY
Bacterial Vaginitis (gardnerella): POSITIVE — AB
Candida Glabrata: NEGATIVE
Candida Vaginitis: POSITIVE — AB
Chlamydia: NEGATIVE
Comment: NEGATIVE
Comment: NEGATIVE
Comment: NEGATIVE
Comment: NEGATIVE
Comment: NEGATIVE
Comment: NORMAL
Neisseria Gonorrhea: NEGATIVE
Trichomonas: NEGATIVE

## 2022-06-28 ENCOUNTER — Telehealth (HOSPITAL_COMMUNITY): Payer: Self-pay | Admitting: Emergency Medicine

## 2022-06-28 MED ORDER — FLUCONAZOLE 150 MG PO TABS: 150.0000 mg | ORAL_TABLET | Freq: Once | ORAL | 0 refills | Status: AC

## 2022-06-28 MED ORDER — METRONIDAZOLE 500 MG PO TABS: 500.0000 mg | ORAL_TABLET | Freq: Two times a day (BID) | ORAL | 0 refills | Status: DC

## 2022-07-05 ENCOUNTER — Telehealth: Payer: Self-pay | Admitting: Internal Medicine

## 2022-07-05 NOTE — Telephone Encounter (Signed)
Please call the patient back.  Pt requesting more med vs appt.  Pt states she went to the ED on  06/25/2022 and was given  7 day course of medication but, feels like her vaginal area is irritated.

## 2022-07-05 NOTE — Telephone Encounter (Signed)
Return pt's call - stated she went to UC (7/29) and was dx wiith BV and yeast infection. She has taken the ABX and "pil" that were ordered.But having vaginal burning today. No available appts today - appt schedule with Dr Clearance Coots tomorrow 8/9 @ 0845 AM.

## 2022-07-06 ENCOUNTER — Encounter: Payer: Self-pay | Admitting: Student

## 2022-07-06 ENCOUNTER — Ambulatory Visit (INDEPENDENT_AMBULATORY_CARE_PROVIDER_SITE_OTHER): Payer: BC Managed Care – PPO | Admitting: Student

## 2022-07-06 ENCOUNTER — Other Ambulatory Visit: Payer: Self-pay

## 2022-07-06 VITALS — BP 131/96 | HR 88 | Temp 98.2°F | Ht 60.0 in | Wt 168.9 lb

## 2022-07-06 DIAGNOSIS — B9689 Other specified bacterial agents as the cause of diseases classified elsewhere: Secondary | ICD-10-CM | POA: Insufficient documentation

## 2022-07-06 DIAGNOSIS — I10 Essential (primary) hypertension: Secondary | ICD-10-CM

## 2022-07-06 DIAGNOSIS — Z0000 Encounter for general adult medical examination without abnormal findings: Secondary | ICD-10-CM

## 2022-07-06 DIAGNOSIS — R300 Dysuria: Secondary | ICD-10-CM

## 2022-07-06 DIAGNOSIS — F17210 Nicotine dependence, cigarettes, uncomplicated: Secondary | ICD-10-CM

## 2022-07-06 DIAGNOSIS — N760 Acute vaginitis: Secondary | ICD-10-CM | POA: Diagnosis not present

## 2022-07-06 HISTORY — DX: Other specified bacterial agents as the cause of diseases classified elsewhere: B96.89

## 2022-07-06 LAB — POCT URINALYSIS DIPSTICK
Bilirubin, UA: NEGATIVE
Glucose, UA: NEGATIVE
Ketones, UA: NEGATIVE
Leukocytes, UA: NEGATIVE
Nitrite, UA: NEGATIVE
Protein, UA: NEGATIVE
Spec Grav, UA: 1.02 (ref 1.010–1.025)
Urobilinogen, UA: 0.2 E.U./dL
pH, UA: 7 (ref 5.0–8.0)

## 2022-07-06 NOTE — Patient Instructions (Addendum)
  Thank you, Ms.Jamyah Carlis Stable, for allowing [place] to provide your care today. Today we discussed . . .  > Painful Urination       - Please take your last pink pill tomorrow and stay well hydrated, we ordered a urine test to make sure you do not have an infection > High Blood Pressure       - There is no need to start you on a medication at this time, but you can lower your blood pressure with regular exercise and a healthy diet > Birth Control       - Let [place] know if you are interested in starting birth control, you have several options available to you including a pill or implantable device > Mood       - Below is the contact information for [facility]:  [place], [place] [address], [address] [place], [place] [address] Phone: [phone] Fax: [phone]    I have ordered the following labs for you:   Lab Orders         Urinalysis, Reflex Microscopic       Tests ordered today:  None   Referrals ordered today:   Referral Orders  No referral(s) requested today      I have ordered the following medication/changed the following medications:   Stop the following medications: There are no discontinued medications.   Start the following medications: No orders of the defined types were placed in this encounter.     Follow up: 3 months    Remember: Please take your last pink pill (fluconazole) and stay hydrated. Regular exercise and a healthy diet will help lower your blood pressure and prevent the need for medications in the future. Let us know if your symptoms worsen. We will see you in 3 months!   Should you have any questions or concerns please call the internal medicine clinic at [phone].     Lajuana Ripple, MD [facility]

## 2022-07-06 NOTE — Assessment & Plan Note (Signed)
Patient has had elevated blood pressure readings in the past. She also has an extensive family history of hypertension.  Today in clinic her blood pressure was 131/96.  Her last visit at [facility] on July 5, there was some discussion about potentially starting amlodipine.  However, given her young age and lack of other risk factors for cardiovascular disease, we will instead encourage lifestyle changes including regular exercise and a diet that includes a lot of whole plant-based foods.  - Recommend regular exercise and healthy diet to lower blood pressure without medications - Encourage patient to invest in a blood pressure cuff and monitor her blood pressure at home

## 2022-07-06 NOTE — Assessment & Plan Note (Signed)
Patient visited urgent care on July 29 for vaginal discharge.  Found to have bacterial vaginosis and candidiasis. She was given metronidazole and took this medication for the prescribed course of 7 days.  Was also given 2 pills of fluconazole to take after course of metronidazole. She is now in between fluconazole and doses.  Since her visit to urgent care, she has not had any more odor or discharge.  However, she now has itching and dysuria both of which started yesterday 8-8.  She also has had a little bit of nausea due to the metronidazole pills. She denies urinary frequency, urine discoloration, rash, fever, chills, and bowel changes. UA ordered today and was negative for signs of infection.  - Instruct patient to take her remaining fluconazole pill and encourage good hydration

## 2022-07-06 NOTE — Progress Notes (Signed)
   CC: ED Follow-up for Vaginal Discharge  HPI:   [name] is a 31 y.o. female with a past medical history of hypertension, obesity, and depression who presents for ED follow-up. She visited the ED on 7-29 for vaginal discharge where she was diagnosed and treated for BV and candidiasis.     Past Medical History:  Diagnosis Date   Blunt abdominal trauma 06/25/2012   Chlamydia    Infection    UTI   Normal pregnancy 06/25/2012   Normal pregnancy 06/25/2012   Ovarian cyst    S/P cesarean section 07/01/2012   S/P cesarean section 07/01/2012     Review of Systems:    Reports vaginal itching, dysuria Denies fever, frequency, urgency, chills, rash, fever, bowel changes    Physical Exam:  Vitals:   07/06/22 0859  BP: (!) 131/96  Pulse: 88  Temp: 98.2 F (36.8 C)  TempSrc: Oral  SpO2: 100%  Weight: 168 lb 14.4 oz (76.6 kg)  Height: 5' (1.524 m)    General:   awake and alert, sitting comfortably in chair, cooperative, not in acute distress Skin:   warm and dry, intact without any obvious lesions or scars, no rashes or lesions  Lungs:   normal respiratory effort, breathing unlabored, symmetrical chest rise, no crackles or wheezing Cardiac:   regular rate and rhythm, normal S1 and S2 Neurologic:   oriented to person-place-time, moving all extremities, sensation to light touch intact, no facial droop Psychiatric:   mood normal with slightly incongruent affect, appears down, intelligible speech    Assessment & Plan:   Bacterial vaginosis Patient visited urgent care on July 29 for vaginal discharge.  Found to have bacterial vaginosis and candidiasis. She was given metronidazole and took this medication for the prescribed course of 7 days.  Was also given 2 pills of fluconazole to take after course of metronidazole. She is now in between fluconazole and doses.  Since her visit to urgent care, she has not had any more odor or discharge.  However, she now has itching and  dysuria both of which started yesterday 8-8.  She also has had a little bit of nausea due to the metronidazole pills. She denies urinary frequency, urine discoloration, rash, fever, chills, and bowel changes. UA ordered today and was negative for signs of infection.  - Instruct patient to take her remaining fluconazole pill and encourage good hydration   Hypertension Patient has had elevated blood pressure readings in the past. She also has an extensive family history of hypertension.  Today in clinic her blood pressure was 131/96.  Her last visit at [facility] on July 5, there was some discussion about potentially starting amlodipine.  However, given her young age and lack of other risk factors for cardiovascular disease, we will instead encourage lifestyle changes including regular exercise and a diet that includes a lot of whole plant-based foods.  - Recommend regular exercise and healthy diet to lower blood pressure without medications - Encourage patient to invest in a blood pressure cuff and monitor her blood pressure at home   Healthcare maintenance Counseling was provided on contraceptive options.  Patient declined at this time.     See Encounters Tab for problem based charting.  Patient seen with Dr. Oswaldo Done

## 2022-07-06 NOTE — Assessment & Plan Note (Signed)
Counseling was provided on contraceptive options.  Patient declined at this time.

## 2022-07-07 NOTE — Progress Notes (Signed)
Internal Medicine Clinic Attending  I saw and evaluated the patient.  I personally confirmed the key portions of the history and exam documented by Dr. Harper and I reviewed pertinent patient test results.  The assessment, diagnosis, and plan were formulated together and I agree with the documentation in the resident's note.  

## 2022-08-05 ENCOUNTER — Ambulatory Visit (INDEPENDENT_AMBULATORY_CARE_PROVIDER_SITE_OTHER): Payer: BC Managed Care – PPO | Admitting: Internal Medicine

## 2022-08-05 ENCOUNTER — Telehealth: Payer: Self-pay | Admitting: *Deleted

## 2022-08-05 DIAGNOSIS — G4700 Insomnia, unspecified: Secondary | ICD-10-CM

## 2022-08-05 MED ORDER — TRAZODONE HCL 50 MG PO TABS: 50.0000 mg | ORAL_TABLET | Freq: Every day | ORAL | 0 refills | Status: DC

## 2022-08-05 NOTE — Progress Notes (Unsigned)
  [facility] Telephone Encounter Continuity Care Appointment  HPI:  This telephone encounter was created for [name] on 08/06/2022 for the following purpose/cc insomnia.   Past Medical History:  Past Medical History:  Diagnosis Date   Blunt abdominal trauma 06/25/2012   Chlamydia    Infection    UTI   Normal pregnancy 06/25/2012   Normal pregnancy 06/25/2012   Ovarian cyst    S/P cesarean section 07/01/2012   S/P cesarean section 07/01/2012     ROS:  Negative unless stated in the HPI   Assessment / Plan / Recommendations:  Please see A&P under problem oriented charting for assessment of the patient's acute and chronic medical conditions.  As always, pt is advised that if symptoms worsen or new symptoms arise, they should go to an urgent care facility or to to ER for further evaluation.   Consent and Medical Decision Making:  Patient discussed with Dr. Criselda Peaches This is a telephone encounter between [name] and Gwenevere Abbot on 08/06/2022 for insomnia. The visit was conducted with the patient located at home and Gwenevere Abbot at [facility]. The patient's identity was confirmed using their DOB and current address. The patient has consented to being evaluated through a telephone encounter and understands the associated risks (an examination cannot be done and the patient may need to come in for an appointment) / benefits (allows the patient to remain at home, decreasing exposure to coronavirus). I personally spent 16 minutes on medical discussion.     Insomnia Pt called regarding her difficulty sleeping and question if she can use trazodone with bupropion. She was previously on this medication and was told to stop when she was started on prozac but is not taking not any longer. When asked about her sleep, she states she has been having difficulty initiating and maintaining sleep. She is taking melatonin but this is not helping. This issue was resolved with  Trazodone. Patient endorsed good sleep hygiene but states it is not working. I advised pt to get evaluated in the office next week as there could be underlying etiologies for her insomnia that need to be ruled out. She did not have trazodone at home, so a one time refill for this medication was sent to the pharmacy until her in-person evaluation.  -Prescribe 50 mg Trazodone (was previously prescribed 100 mg) -Follow up in person next week for evaluation of her insomnia.

## 2022-08-05 NOTE — Telephone Encounter (Signed)
Message from front office- "please call patient she wants to know if she can take trazadone with a new medication she was prescribed"  Return pt's call - stated she has not been sleeping and she wants to know if the doctor can prescribe Trazadone (discontinued 06/01/22) even though Welbutrin was started at this OV also. Informed pt she needs to discuss this with the doctor - telehealth appt schedule w/Dr Welton Flakes today @ 1500 PM.

## 2022-08-06 NOTE — Assessment & Plan Note (Signed)
Pt called regarding her difficulty sleeping and question if she can use trazodone with bupropion. She was previously on this medication and was told to stop when she was started on prozac but is not taking not any longer. When asked about her sleep, she states she has been having difficulty initiating and maintaining sleep. She is taking melatonin but this is not helping. This issue was resolved with Trazodone. Patient endorsed good sleep hygiene but states it is not working. I advised pt to get evaluated in the office next week as there could be underlying etiologies for her insomnia that need to be ruled out. She did not have trazodone at home, so a one time refill for this medication was sent to the pharmacy until her in-person evaluation.  -Prescribe 50 mg Trazodone (was previously prescribed 100 mg) -Follow up in person next week for evaluation of her insomnia.

## 2022-08-13 IMAGING — MG DIGITAL DIAGNOSTIC BILAT W/ TOMO W/ CAD
6 of 10 series · 6 of 30 positions shown · non-contrast
Comparison: None.
COMPARISON: None.

Addendum:
CLINICAL DATA: Patient presents for palpable mass within the upper
inner left breast.

EXAM:
DIGITAL DIAGNOSTIC BILATERAL MAMMOGRAM WITH TOMOSYNTHESIS AND CAD;
ULTRASOUND LEFT BREAST LIMITED
TECHNIQUE: Bilateral digital diagnostic mammography and breast tomosynthesis
was performed. The images were evaluated with computer-aided
detection.; Targeted ultrasound examination of the left breast was
performed.

[L MLO synth-2D]
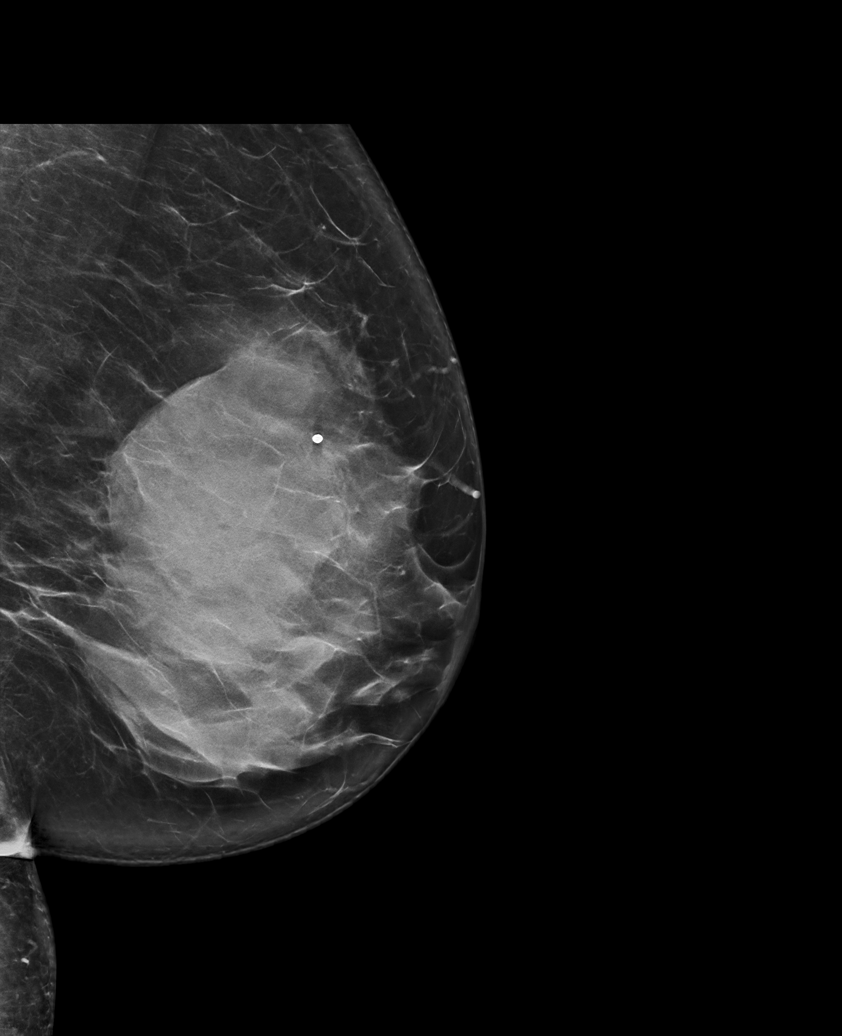

[R MLO synth-2D]
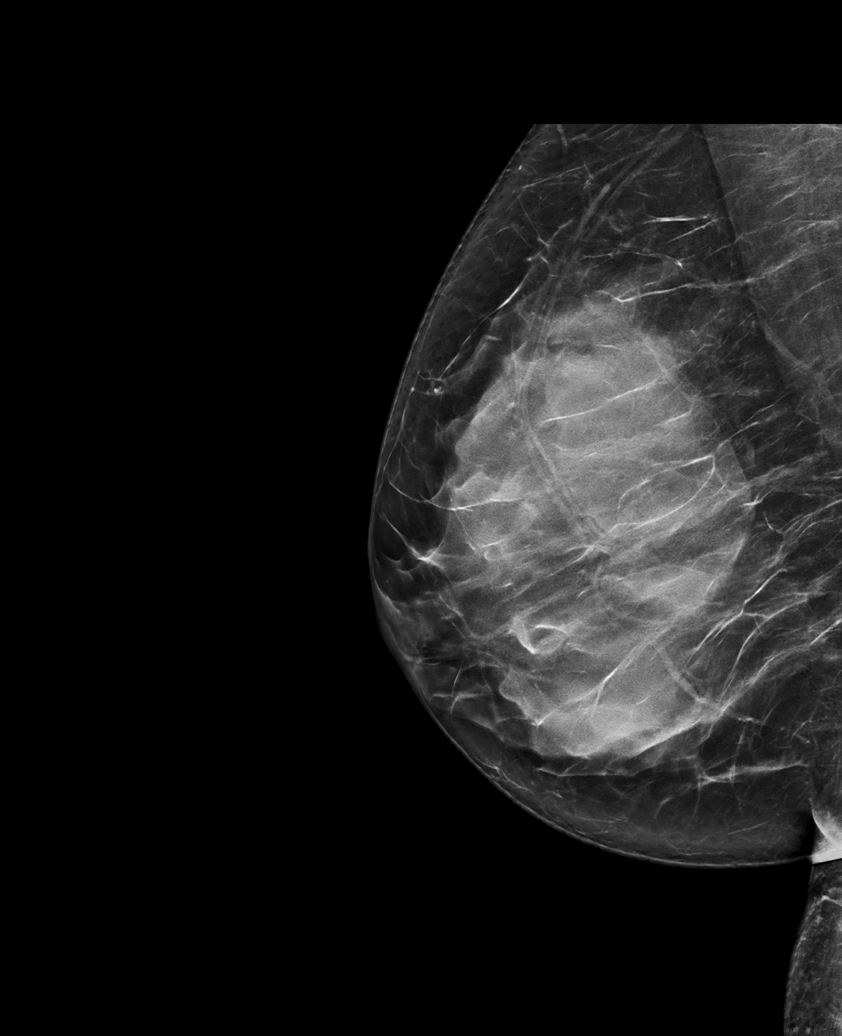

[R CC synth-2D]
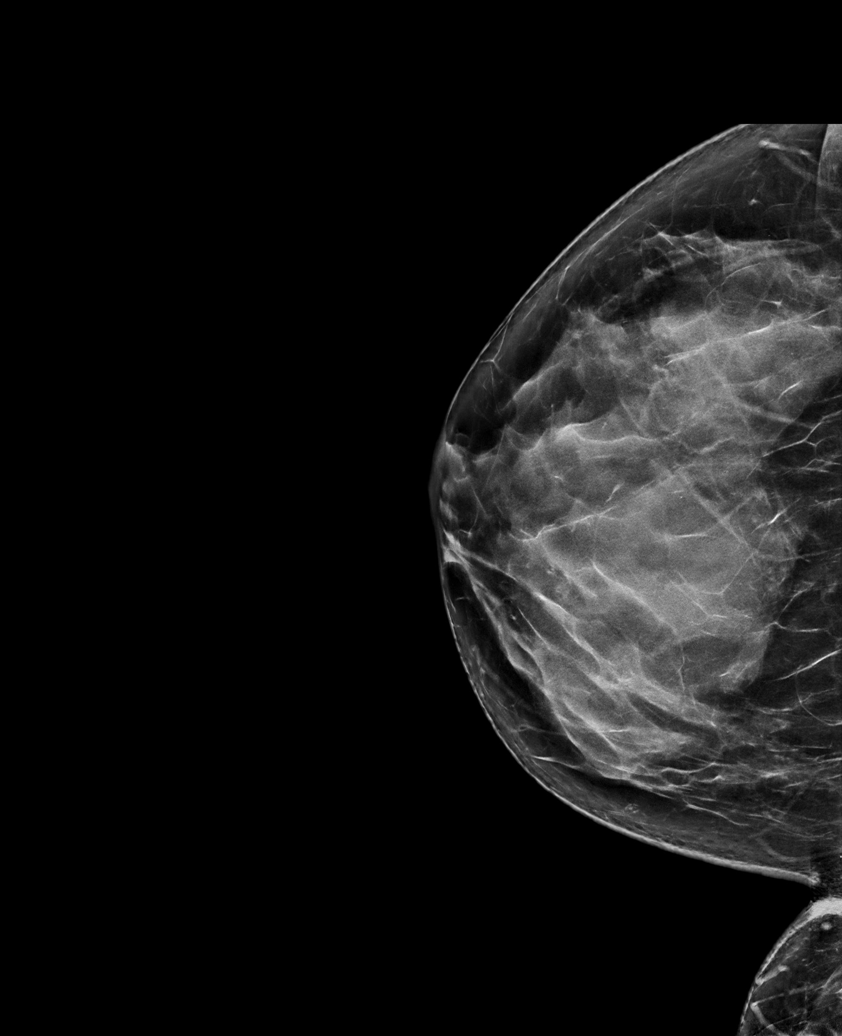

[L TAN synth-2D]
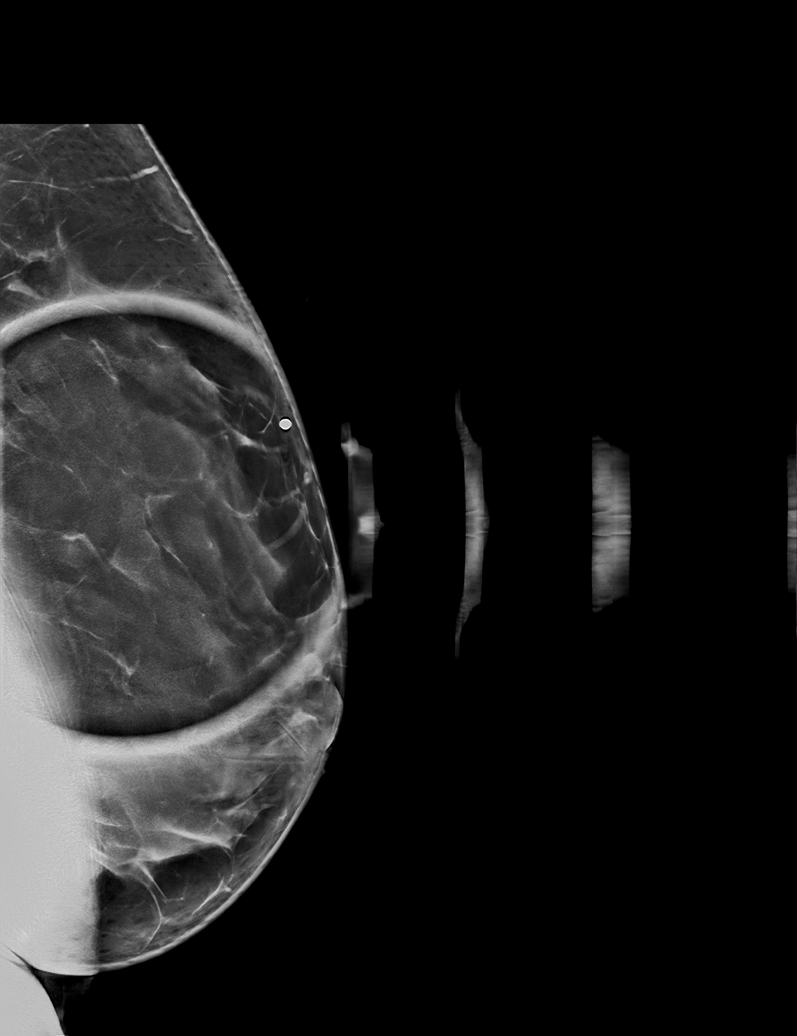

[L CC synth-2D]
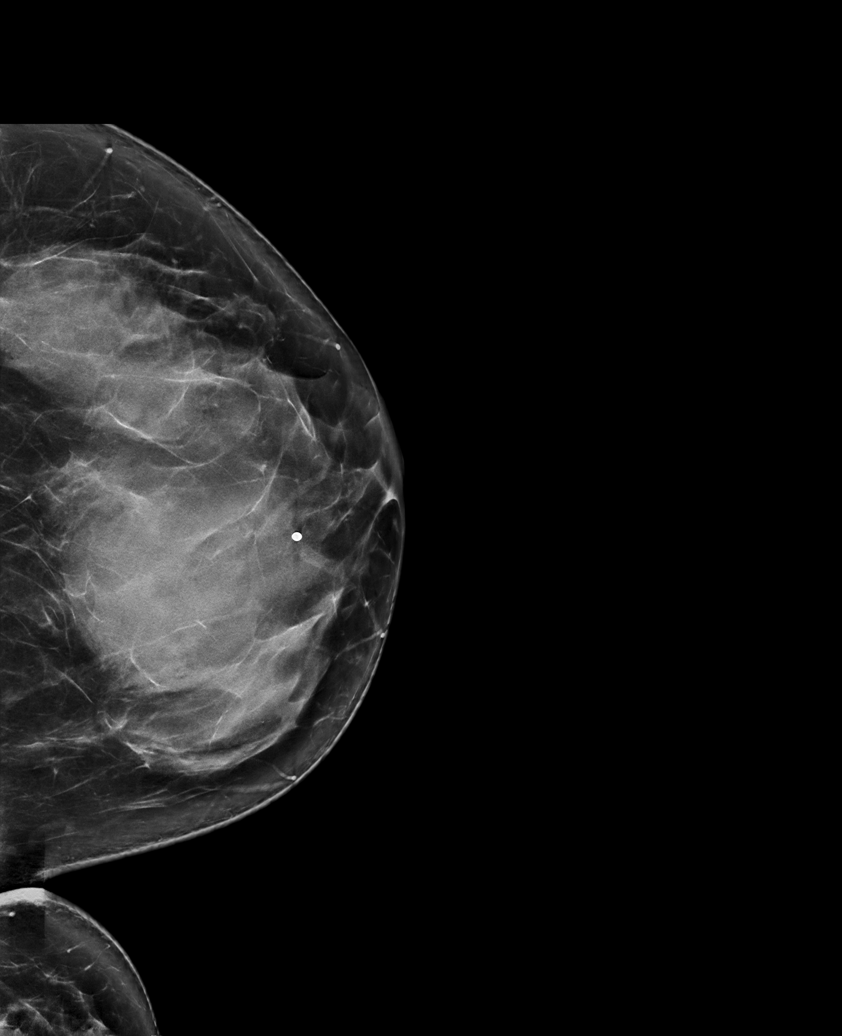

[R MLO tomo · tomo slice 42/83.0]
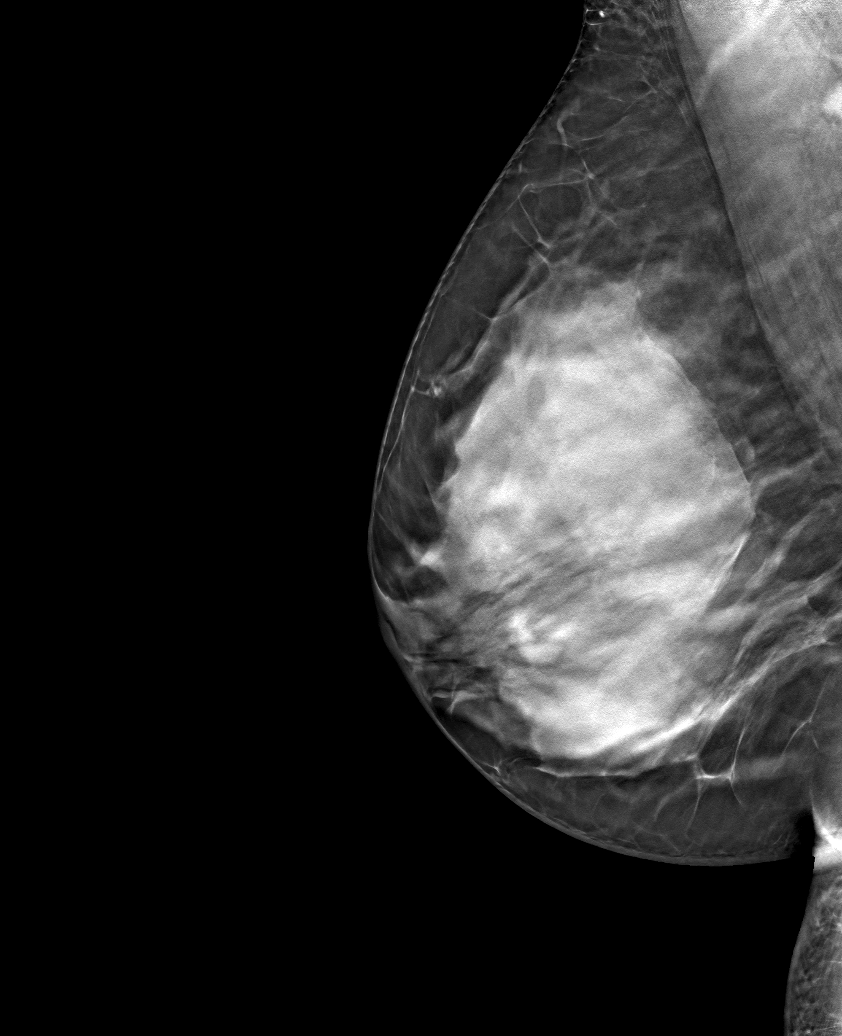

[6 of 30 positions shown; findings below may reference images not displayed]

ACR Breast Density Category c: The breast tissue is heterogeneously
dense, which may obscure small masses.
FINDINGS: No concerning masses, calcifications or distortion identified within
either breast. Dense tissue is demonstrated underlying the palpable
left breast

On physical exam, there is a palpable mass within the upper inner
left breast.

Targeted ultrasound is performed, showing a 3.1 x 1.7 x 2.7 cm cyst
left breast 10 o'clock position 5 cm from nipple.
IMPRESSION: Left breast cyst at the site of palpable concern. No mammographic
evidence for malignancy.

RECOMMENDATION:
Screening mammogram in one year.(Code:RQ-K-7XQ)

I have discussed the findings and recommendations with the patient.
If applicable, a reminder letter will be sent to the patient
regarding the next appointment.

BI-RADS CATEGORY  2: Benign.

ADDENDUM:
Addendum for correction of recommendation.

Screening mammogram at age 40 unless there are persistent or
intervening clinical concerns. (Code:35-O-Q0R)

*** End of Addendum ***
ACR Breast Density Category c: The breast tissue is heterogeneously
dense, which may obscure small masses.
FINDINGS: No concerning masses, calcifications or distortion identified within
either breast. Dense tissue is demonstrated underlying the palpable
left breast

On physical exam, there is a palpable mass within the upper inner
left breast.

Targeted ultrasound is performed, showing a 3.1 x 1.7 x 2.7 cm cyst
left breast 10 o'clock position 5 cm from nipple.
IMPRESSION: Left breast cyst at the site of palpable concern. No mammographic
evidence for malignancy.

RECOMMENDATION:
Screening mammogram in one year.(Code:RQ-K-7XQ)

I have discussed the findings and recommendations with the patient.
If applicable, a reminder letter will be sent to the patient
regarding the next appointment.

BI-RADS CATEGORY  2: Benign.

## 2022-08-16 NOTE — Progress Notes (Signed)
Internal Medicine Clinic Attending  Case discussed with Dr. Khan  at the time of the visit.  We reviewed the resident's history and pertinent patient test results.  I agree with the assessment, diagnosis, and plan of care documented in the resident's note.  

## 2022-08-19 ENCOUNTER — Ambulatory Visit (INDEPENDENT_AMBULATORY_CARE_PROVIDER_SITE_OTHER): Payer: BC Managed Care – PPO | Admitting: Internal Medicine

## 2022-08-19 VITALS — BP 145/105 | HR 88 | Temp 98.5°F | Ht 60.0 in | Wt 169.0 lb

## 2022-08-19 DIAGNOSIS — I10 Essential (primary) hypertension: Secondary | ICD-10-CM | POA: Diagnosis not present

## 2022-08-19 DIAGNOSIS — F411 Generalized anxiety disorder: Secondary | ICD-10-CM | POA: Diagnosis not present

## 2022-08-19 DIAGNOSIS — F332 Major depressive disorder, recurrent severe without psychotic features: Secondary | ICD-10-CM

## 2022-08-19 DIAGNOSIS — G4700 Insomnia, unspecified: Secondary | ICD-10-CM

## 2022-08-19 DIAGNOSIS — R519 Headache, unspecified: Secondary | ICD-10-CM | POA: Insufficient documentation

## 2022-08-19 DIAGNOSIS — F17210 Nicotine dependence, cigarettes, uncomplicated: Secondary | ICD-10-CM

## 2022-08-19 DIAGNOSIS — G44209 Tension-type headache, unspecified, not intractable: Secondary | ICD-10-CM

## 2022-08-19 DIAGNOSIS — F32A Depression, unspecified: Secondary | ICD-10-CM | POA: Insufficient documentation

## 2022-08-19 MED ORDER — TRAZODONE HCL 100 MG PO TABS: 100.0000 mg | ORAL_TABLET | Freq: Every day | ORAL | 3 refills | Status: DC

## 2022-08-19 MED ORDER — AMLODIPINE BESYLATE 5 MG PO TABS: 5.0000 mg | ORAL_TABLET | Freq: Every day | ORAL | 11 refills | Status: DC

## 2022-08-19 NOTE — Assessment & Plan Note (Addendum)
Headache consistent with TTH. Given multiple factors poor sleep, and increased stress. Advised pt lifestyle modifications including exercise, limiting caffenated drinks and addressing her insomnia and GAD, MDD. BP also high which can cause a headache but not endorsing DOE, CP, or blurry vision. Will start amlodipine which will help with HTN and her headaches.

## 2022-08-19 NOTE — Assessment & Plan Note (Signed)
Chronic problem for the patient since 10 years. Patient was taken melatonin and benadryl but states they are not working. She was started on Trazodone and that worked well work her. I prescribed her Trazodone 50 mg qd and that has helped her some but will increase her dose to 100 mg as that was her previous dose and worked better for her. Her insomnia can contribute to worsening of her other problems including MDD, and GAD and vice versa.

## 2022-08-19 NOTE — Assessment & Plan Note (Signed)
BP in the clinic 143/100, repeat 145/105. Pt not monitoring at home. Will start amlodipine 5 mg qd and follow up in 2 weeks. Pt to obtain blood pressure monitor today.

## 2022-08-19 NOTE — Progress Notes (Unsigned)
CC: insomnia  HPI:  [name] is a 31 y.o. with medical history of HTN, MDD, and obesity presenting to [facility] for insomnia.   Please see problem-based list for further details, assessments, and plans.  Past Medical History:  Diagnosis Date   Blunt abdominal trauma 06/25/2012   Chlamydia    Infection    UTI   Normal pregnancy 06/25/2012   Normal pregnancy 06/25/2012   Ovarian cyst    S/P cesarean section 07/01/2012   S/P cesarean section 07/01/2012     Current Outpatient Medications (Cardiovascular):    amLODipine (NORVASC) 5 MG tablet, Take 1 tablet (5 mg total) by mouth daily.     Current Outpatient Medications (Other):    traZODone (DESYREL) 100 MG tablet, Take 1 tablet (100 mg total) by mouth at bedtime.   buPROPion (WELLBUTRIN XL) 300 MG 24 hr tablet, Take 1 tablet (300 mg total) by mouth every morning.   metroNIDAZOLE (FLAGYL) 500 MG tablet, Take 1 tablet (500 mg total) by mouth 2 (two) times daily.  Review of Systems:  Review of system negative unless stated in the problem list or HPI.    Physical Exam:  Vitals:   08/19/22 0956 08/19/22 1053  BP: (!) 143/100 (!) 145/105  Pulse: 96 88  Temp: 98.5 F (36.9 C)   TempSrc: Oral   SpO2: 98%   Weight: 169 lb (76.7 kg)   Height: 5' (1.524 m)     Physical Exam General: NAD HENT: NCAT Lungs: CTAB, no wheeze, rhonchi or rales.  Cardiovascular: Normal heart sounds, no r/m/g, 2+ pulses in all extremities. No LE edema Abdomen: No TTP, normal bowel sounds MSK: No asymmetry or muscle atrophy.  Skin: no lesions noted on exposed skin Neuro: Alert and oriented x4. CN grossly intact Psych: Normal mood and normal affect   Assessment & Plan:   Headache Headache consistent with TTH. Given multiple factors poor sleep, and increased stress. Advised pt lifestyle modifications including exercise, limiting caffenated drinks and addressing her insomnia and GAD, MDD. BP also high which can cause a headache but not  endorsing DOE, CP, or blurry vision. Will start amlodipine which will help with HTN and her headaches.   Insomnia Chronic problem for the patient since 10 years. Patient was taken melatonin and benadryl but states they are not working. She was started on Trazodone and that worked well work her. I prescribed her Trazodone 50 mg qd and that has helped her some but will increase her dose to 100 mg as that was her previous dose and worked better for her. Her insomnia can contribute to worsening of her other problems including MDD, and GAD and vice versa.   Hypertension BP in the clinic 143/100, repeat 145/105. Pt not monitoring at home. Will start amlodipine 5 mg qd and follow up in 2 weeks. Pt to obtain blood pressure monitor today.   GAD (generalized anxiety disorder) Patient on Wellbutrin 300 mg qd. Her GAD is multifactorial and worsened by her insomnia and her heavy caffeine use. Advised her lifestyle modifications including exercise, meditation, and decreasing caffeine intake. Pt would like to get therapy but states [facility] has not openings available until 6 months. Advised she get tele-health therapy while she waits for in person appointment to open up. Pt in agreement.  -Continue Wellbutrin 300 mg qd, increase Trazodone to 100 mg qd.  -Telehealth therapy, will ask social work to help pt find a sooner appointment with behavioral health.   Major depression PHQ 9 is  17. Pt is on Wellbutrin 300 mg qd. Will continue this and increase her Trazodone to 100 mg as it is helping her with sleep and that has significant contribution to her mood. She states she benefit the most from therapy. See GAD tab for further plan.    See Encounters Tab for problem based charting.  Patient discussed with Dr. Karrie Meres, MD Eligha Bridegroom. [facility] Internal Medicine Residency, PGY-2

## 2022-08-19 NOTE — Assessment & Plan Note (Signed)
PHQ 9 is 17. Pt is on Wellbutrin 300 mg qd. Will continue this and increase her Trazodone to 100 mg as it is helping her with sleep and that has significant contribution to her mood. She states she benefit the most from therapy. See GAD tab for further plan.

## 2022-08-19 NOTE — Patient Instructions (Signed)
[name], it was a pleasure seeing you today! You endorsed feeling well today. Below are some of the things we talked about this visit. We look forward to seeing you in the follow up appointment!  Today we discussed: Your blood pressure is elevated today. We will start amlodipine 5 mg qd. Take it every day and bring your monitor to the next visit.  We will increase your trazodone to 100 mg qd. I will check with social work to see if a behavioral therapy office can see you sooner than 6 months.  Follow up in 2 weeks.   I have ordered the following labs today:  Lab Orders  No laboratory test(s) ordered today      Referrals ordered today:   Referral Orders  No referral(s) requested today     I have ordered the following medication/changed the following medications:   Stop the following medications: Medications Discontinued During This Encounter  Medication Reason   traZODone (DESYREL) 50 MG tablet Dose change     Start the following medications: Meds ordered this encounter  Medications   amLODipine (NORVASC) 5 MG tablet    Sig: Take 1 tablet (5 mg total) by mouth daily.    Dispense:  30 tablet    Refill:  11   traZODone (DESYREL) 100 MG tablet    Sig: Take 1 tablet (100 mg total) by mouth at bedtime.    Dispense:  90 tablet    Refill:  3     Follow-up: 2 week HTN follow up  Please make sure to arrive 15 minutes prior to your next appointment. If you arrive late, you may be asked to reschedule.   We look forward to seeing you next time. Please call our clinic at [phone] if you have any questions or concerns. The best time to call is Monday-Friday from 9am-4pm, but there is someone available 24/7. If after hours or the weekend, call the main hospital number and ask for the Internal Medicine Resident On-Call. If you need medication refills, please notify your pharmacy one week in advance and they will send [place] a request.  Thank you for letting us take part in  your care. Wishing you the best!  Thank you, Idamae Schuller, MD

## 2022-08-19 NOTE — Assessment & Plan Note (Signed)
Patient on Wellbutrin 300 mg qd. Her GAD is multifactorial and worsened by her insomnia and her heavy caffeine use. Advised her lifestyle modifications including exercise, meditation, and decreasing caffeine intake. Pt would like to get therapy but states [facility] has not openings available until 6 months. Advised she get tele-health therapy while she waits for in person appointment to open up. Pt in agreement.  -Continue Wellbutrin 300 mg qd, increase Trazodone to 100 mg qd.  -Telehealth therapy, will ask social work to help pt find a sooner appointment with behavioral health.

## 2022-08-23 NOTE — Progress Notes (Signed)
Internal Medicine Clinic Attending  Case discussed with the resident at the time of the visit.  We reviewed the resident's history and exam and pertinent patient test results.  I agree with the assessment, diagnosis, and plan of care documented in the resident's note.  

## 2022-09-26 ENCOUNTER — Other Ambulatory Visit: Payer: Self-pay | Admitting: Internal Medicine

## 2022-09-26 DIAGNOSIS — F331 Major depressive disorder, recurrent, moderate: Secondary | ICD-10-CM

## 2022-10-13 ENCOUNTER — Other Ambulatory Visit: Payer: Self-pay

## 2022-10-13 DIAGNOSIS — F331 Major depressive disorder, recurrent, moderate: Secondary | ICD-10-CM

## 2022-10-13 NOTE — Telephone Encounter (Signed)
buPROPion (WELLBUTRIN XL) 300 MG 24 hr tablet,  traZODone (DESYREL) 100 MG tablet, refill request @ [facility] on [address].

## 2022-10-14 MED ORDER — TRAZODONE HCL 100 MG PO TABS: 100.0000 mg | ORAL_TABLET | Freq: Every day | ORAL | 3 refills | Status: DC

## 2022-10-14 MED ORDER — BUPROPION HCL ER (XL) 300 MG PO TB24: 300.0000 mg | ORAL_TABLET | Freq: Every morning | ORAL | 0 refills | Status: DC

## 2022-11-09 ENCOUNTER — Encounter: Payer: BC Managed Care – PPO | Admitting: Internal Medicine

## 2022-11-09 NOTE — Progress Notes (Deleted)
   CC: elevated BP  HPI:  [name] is a 31 y.o. female living with a history stated below and presents today for elevated BP follow up. Please see problem based assessment and plan for additional details.  Past Medical History:  Diagnosis Date   Blunt abdominal trauma 06/25/2012   Chlamydia    Infection    UTI   Normal pregnancy 06/25/2012   Normal pregnancy 06/25/2012   Ovarian cyst    S/P cesarean section 07/01/2012   S/P cesarean section 07/01/2012    Current Outpatient Medications on File Prior to Visit  Medication Sig Dispense Refill   amLODipine (NORVASC) 5 MG tablet Take 1 tablet (5 mg total) by mouth daily. 30 tablet 11   buPROPion (WELLBUTRIN XL) 300 MG 24 hr tablet Take 1 tablet (300 mg total) by mouth every morning. 30 tablet 0   traZODone (DESYREL) 100 MG tablet Take 1 tablet (100 mg total) by mouth at bedtime. 90 tablet 3   No current facility-administered medications on file prior to visit.    Family History  Problem Relation Age of Onset   Hypertension Mother    Kidney disease Father    Hypertension Maternal Grandfather    Diabetes Paternal Grandmother    Kidney disease Paternal Grandmother    Anesthesia problems Neg Hx     Social History   Socioeconomic History   Marital status: Single    Spouse name: Not on file   Number of children: Not on file   Years of education: Not on file   Highest education level: Not on file  Occupational History   Not on file  Tobacco Use   Smoking status: Every Day    Types: Cigarettes   Smokeless tobacco: Never   Tobacco comments:    Quit 06/21/22  Vaping Use   Vaping Use: Every day  Substance and Sexual Activity   Alcohol use: Yes   Drug use: No   Sexual activity: Yes    Birth control/protection: None  Other Topics Concern   Not on file  Social History Narrative   Not on file   Social Determinants of Health   Financial Resource Strain: Not on file  Food Insecurity: Not on file  Transportation  Needs: Not on file  Physical Activity: Not on file  Stress: Not on file  Social Connections: Not on file  Intimate Partner Violence: Not on file    Review of Systems: ROS negative except for what is noted on the assessment and plan.  There were no vitals filed for this visit.  Physical Exam: Constitutional: well-appearing *** sitting in ***, in no acute distress HENT: normocephalic atraumatic, mucous membranes moist Eyes: conjunctiva non-erythematous Cardiovascular: regular rate and rhythm, no m/r/g Pulmonary/Chest: normal work of breathing on room air, lungs clear to auscultation bilaterally Abdominal: soft, non-tender, non-distended MSK: normal bulk and tone Neurological: alert & oriented x 3, no focal deficit Skin: warm and dry Psych: normal mood and behavior  Assessment & Plan:   HTN: amlodipne 5 - TSH nml July   Patient {GC/GE:[id number]::"discussed with","seen with"} Dr. {[id number]::"WJXBJYNW","G. Hoffman","Mullen","Narendra","Vincent","Guilloud","Lau","Machen"}  No problem-specific Assessment & Plan notes found for this encounter.   Katherine Robbins, D.O. [facility] Internal Medicine, PGY-2 Phone: [phone] Date 11/09/2022 Time 7:28 AM

## 2022-11-23 DIAGNOSIS — R059 Cough, unspecified: Secondary | ICD-10-CM | POA: Diagnosis not present

## 2022-11-23 DIAGNOSIS — R519 Headache, unspecified: Secondary | ICD-10-CM | POA: Diagnosis not present

## 2022-11-23 DIAGNOSIS — R509 Fever, unspecified: Secondary | ICD-10-CM | POA: Diagnosis not present

## 2023-01-05 NOTE — Progress Notes (Signed)
   CC: Vaginal Discharge  HPI:   [name] is a 32 y.o. female with a past medical history of obesity, hypertension, and anxiety-depression who presents for STI testing. She was last seen at [facility] in 07-2022.   Past Medical History:  Diagnosis Date   Blunt abdominal trauma 06/25/2012   Chlamydia    Infection    UTI   Normal pregnancy 06/25/2012   Normal pregnancy 06/25/2012   Ovarian cyst    S/P cesarean section 07/01/2012   S/P cesarean section 07/01/2012     Review of Systems:    Reports vaginal discharge, pruritus Denies rash, lesions, fever, chills, sweats, palpitations    Physical Exam:  Vitals:   01/06/23 0855  BP: 129/83  Pulse: 100  Temp: 98.6 F (37 C)  TempSrc: Oral  SpO2: 100%  Weight: 177 lb 12.8 oz (80.6 kg)  Height: 5' (1.524 m)    General:   awake and alert, sitting comfortably in chair, cooperative, not in acute distress Skin:   warm and dry, intact without any obvious lesions or scars, no rashes Lungs:   normal respiratory effort, breathing unlabored, symmetrical chest rise Cardiac:   regular rate and rhythm, normal S1 and S2 Vaginal:   scant thin clear-whitish discharge at introitus, labia without lesions or erythema  Neurologic:   oriented to person-place-time, moving all extremities, no gross focal deficits Psychiatric:   euthymic mood with congruent affect, intelligible speech    Assessment & Plan:   Vaginal discharge Patient reports 5-6 days of chunky white vaginal discharge associated with mild pruritus. She expressed concern that she may have a yeast infection, which she has had in the past with similar symptoms. Reports recent sexual activity with one partner and without protection. Denies rash, lesions, sores, bloody discharge, and pain with intercourse. Prior testing performed 05-2022 notable for candida and gardnerella infection. Exam notable for scant thin clear-whitish discharge at introitus. Negative for rash or lesions. Pregnancy  test negative.  - Check cytology for Chla, Gono, Tric, Cand, and Gard via vaginal swab - Prescribe appropriate antimicrobial agents if results positive     Hypertension Patient has history of hypertension and was prescribed amlodipine at her last visit in 07-2022. She never retrieved this medication from the pharmacy, preferring instead to manage her blood pressure with lifestyle. She has changed jobs and reports significantly less stress than at her last visit. Today in clinic, her blood pressure was 129/83. Denies lightheadedness, dizziness, blurry vision, headache, anxiety, chest pain, and palpitations. Discussed potential need for medications in the future, as patient is technically above our goal of <130/80. Offered additional resources for stress management, which she declined at this time.  - Encourage regular exercise and limiting salt intake - Consider starting amlodipine at next visit if blood pressure elevated    Healthcare maintenance Patient due for DTaP vaccination and expressed an interest in receiving it. - Administer DTaP vaccine      See Encounters Tab for problem based charting.  Patient discussed with Dr.  Cain Sieve

## 2023-01-06 ENCOUNTER — Other Ambulatory Visit (HOSPITAL_COMMUNITY)
Admission: RE | Admit: 2023-01-06 | Discharge: 2023-01-06 | Disposition: A | Discharge status: Home / Self Care | Payer: BC Managed Care – PPO | Source: Ambulatory Visit | Attending: Internal Medicine | Admitting: Internal Medicine

## 2023-01-06 ENCOUNTER — Encounter: Payer: Self-pay | Admitting: Student

## 2023-01-06 ENCOUNTER — Ambulatory Visit (INDEPENDENT_AMBULATORY_CARE_PROVIDER_SITE_OTHER): Payer: BC Managed Care – PPO | Admitting: Student

## 2023-01-06 VITALS — BP 129/83 | HR 100 | Temp 98.6°F | Ht 60.0 in | Wt 177.8 lb

## 2023-01-06 DIAGNOSIS — Z0000 Encounter for general adult medical examination without abnormal findings: Secondary | ICD-10-CM

## 2023-01-06 DIAGNOSIS — Z3202 Encounter for pregnancy test, result negative: Secondary | ICD-10-CM | POA: Diagnosis not present

## 2023-01-06 DIAGNOSIS — Z7251 High risk heterosexual behavior: Secondary | ICD-10-CM | POA: Insufficient documentation

## 2023-01-06 DIAGNOSIS — Z23 Encounter for immunization: Secondary | ICD-10-CM | POA: Diagnosis not present

## 2023-01-06 DIAGNOSIS — N898 Other specified noninflammatory disorders of vagina: Secondary | ICD-10-CM | POA: Insufficient documentation

## 2023-01-06 DIAGNOSIS — F17210 Nicotine dependence, cigarettes, uncomplicated: Secondary | ICD-10-CM

## 2023-01-06 DIAGNOSIS — I10 Essential (primary) hypertension: Secondary | ICD-10-CM | POA: Diagnosis not present

## 2023-01-06 LAB — POCT URINE PREGNANCY: Preg Test, Ur: NEGATIVE

## 2023-01-06 NOTE — Assessment & Plan Note (Signed)
Patient has history of hypertension and was prescribed amlodipine at her last visit in 07-2022. She never retrieved this medication from the pharmacy, preferring instead to manage her blood pressure with lifestyle. She has changed jobs and reports significantly less stress than at her last visit. Today in clinic, her blood pressure was 129/83. Denies lightheadedness, dizziness, blurry vision, headache, anxiety, chest pain, and palpitations. Discussed potential need for medications in the future, as patient is technically above our goal of <130/80. Offered additional resources for stress management, which she declined at this time.  - Encourage regular exercise and limiting salt intake - Consider starting amlodipine at next visit if blood pressure elevated

## 2023-01-06 NOTE — Assessment & Plan Note (Signed)
Patient due for DTaP vaccination and expressed an interest in receiving it. - Administer DTaP vaccine

## 2023-01-06 NOTE — Assessment & Plan Note (Addendum)
Patient reports 5-6 days of chunky white vaginal discharge associated with mild pruritus. She expressed concern that she may have a yeast infection, which she has had in the past with similar symptoms. Reports recent sexual activity with one partner and without protection. Denies rash, lesions, sores, bloody discharge, and pain with intercourse. Prior testing performed 05-2022 notable for candida and gardnerella infection. Exam notable for scant thin clear-whitish discharge at introitus. Negative for rash or lesions. Pregnancy test negative.  - Check cytology for Chla, Gono, Tric, Cand, and Gard via vaginal swab - Prescribe appropriate antimicrobial agents if results positive

## 2023-01-06 NOTE — Patient Instructions (Signed)
  Thank you, Katherine Robbins, for allowing [place] to provide your care today. Today we discussed . . .  > Vaginal discharge       - we are sending the swab that we collected to the lab for analysis and will call you with the results       - if you test positive for an infection, then we will start you on a medication       - we also performed a pregnancy test and will call you with the results > Hypertension       - your blood pressure today was good and it is good to hear that you are feeling less anxious with your new job!   I have ordered the following labs for you:  Lab Orders         POCT Urine Pregnancy       Tests ordered today:  STI testing   Referrals ordered today:   Referral Orders  No referral(s) requested today      I have ordered the following medication/changed the following medications:   Stop the following medications: There are no discontinued medications.   Start the following medications: No orders of the defined types were placed in this encounter.     Follow up: 6 months    Remember:  We will call you with the results of your tests and start you on a medication if needed. Please schedule a visit to return in about six months or sooner if needed!   Should you have any questions or concerns please call the internal medicine clinic at [phone].     Roswell Nickel, MD [place]

## 2023-01-09 LAB — CERVICOVAGINAL ANCILLARY ONLY
Bacterial Vaginitis (gardnerella): POSITIVE — AB
Candida Glabrata: NEGATIVE
Candida Vaginitis: POSITIVE — AB
Chlamydia: NEGATIVE
Comment: NEGATIVE
Comment: NEGATIVE
Comment: NEGATIVE
Comment: NEGATIVE
Comment: NEGATIVE
Comment: NORMAL
Neisseria Gonorrhea: NEGATIVE
Trichomonas: NEGATIVE

## 2023-01-13 ENCOUNTER — Other Ambulatory Visit: Payer: Self-pay | Admitting: Student

## 2023-01-13 MED ORDER — FLUCONAZOLE 100 MG PO TABS: 150.0000 mg | ORAL_TABLET | Freq: Every day | ORAL | 0 refills | Status: AC

## 2023-01-13 MED ORDER — METRONIDAZOLE 500 MG PO TABS: 500.0000 mg | ORAL_TABLET | Freq: Two times a day (BID) | ORAL | 0 refills | Status: AC

## 2023-01-16 NOTE — Progress Notes (Signed)
Internal Medicine Clinic Attending  Case discussed with Dr. Jodi Mourning  At the time of the visit.  We reviewed the resident's history and exam and pertinent patient test results.  I agree with the assessment, diagnosis, and plan of care documented in the resident's note.    Agree with Dr. Jacelyn Grip plan to treat BV and candidal infection

## 2023-08-25 ENCOUNTER — Other Ambulatory Visit: Payer: Self-pay | Admitting: Urgent Care

## 2023-08-25 ENCOUNTER — Ambulatory Visit (INDEPENDENT_AMBULATORY_CARE_PROVIDER_SITE_OTHER): Payer: Self-pay

## 2023-08-25 ENCOUNTER — Ambulatory Visit
Admission: EM | Admit: 2023-08-25 | Discharge: 2023-08-25 | Disposition: A | Discharge status: Home / Self Care | Payer: BC Managed Care – PPO | Attending: Internal Medicine | Admitting: Internal Medicine

## 2023-08-25 ENCOUNTER — Encounter: Payer: Self-pay | Admitting: Emergency Medicine

## 2023-08-25 DIAGNOSIS — M25562 Pain in left knee: Secondary | ICD-10-CM

## 2023-08-25 DIAGNOSIS — M25561 Pain in right knee: Secondary | ICD-10-CM | POA: Diagnosis not present

## 2023-08-25 DIAGNOSIS — G8929 Other chronic pain: Secondary | ICD-10-CM | POA: Diagnosis not present

## 2023-08-25 MED ORDER — PREDNISONE 20 MG PO TABS: ORAL_TABLET | ORAL | 0 refills | Status: DC

## 2023-08-25 NOTE — ED Provider Notes (Signed)
[facility]  Note:  This document was prepared using Conservation officer, historic buildings and may include unintentional dictation errors.  MRN: [id number] DOB: [birth year]  Subjective:   Katherine Robbins is a 32 y.o. female presenting for 1 week history of persistent and worsening bilateral knee pain left worse than the right.  Reports that pain is severe, is making it difficult for her to stand, walk or even sleep.  Patient does extensive walking, standing, lifting, squatting for her work.  Works as a Museum/gallery exhibitions officer.  Has been using multiple anti-inflammatory medications without relief.  No previous history of musculoskeletal disorders, connective tissue disorders, knee injuries.  No current facility-administered medications for this encounter.  Current Outpatient Medications:    amLODipine (NORVASC) 5 MG tablet, Take 1 tablet (5 mg total) by mouth daily., Disp: 30 tablet, Rfl: 11   buPROPion (WELLBUTRIN XL) 300 MG 24 hr tablet, Take 1 tablet (300 mg total) by mouth every morning., Disp: 30 tablet, Rfl: 0   traZODone (DESYREL) 100 MG tablet, Take 1 tablet (100 mg total) by mouth at bedtime., Disp: 90 tablet, Rfl: 3    Allergies  Allergen Reactions   Apple Juice Other (See Comments)    Throat and ears itch   Shellfish Allergy Itching    Throat and ear itch    Past Medical History:  Diagnosis Date   Blunt abdominal trauma 06/25/2012   Chlamydia    Infection    UTI   Normal pregnancy 06/25/2012   Normal pregnancy 06/25/2012   Ovarian cyst    S/P cesarean section 07/01/2012   S/P cesarean section 07/01/2012     Past Surgical History:  Procedure Laterality Date   CESAREAN SECTION  07/01/2012   Procedure: CESAREAN SECTION;  Surgeon: Sherron Monday, MD;  Location: [facility];  Service: Gynecology;  Laterality: N/A;  Primary cesarean section with delivery of baby boy at 75.   DILATION AND EVACUATION N/A 03/14/2017   Procedure: DILATATION AND  EVACUATION;  Surgeon: Harold Hedge, MD;  Location: [facility];  Service: Gynecology;  Laterality: N/A;   NO PAST SURGERIES      Family History  Problem Relation Age of Onset   Hypertension Mother    Kidney disease Father    Hypertension Maternal Grandfather    Diabetes Paternal Grandmother    Kidney disease Paternal Grandmother    Anesthesia problems Neg Hx     Social History   Tobacco Use   Smoking status: Every Day    Types: Cigarettes   Smokeless tobacco: Never   Tobacco comments:    Quit 06/21/22  Vaping Use   Vaping status: Every Day  Substance Use Topics   Alcohol use: Yes   Drug use: No    ROS   Objective:   Vitals: BP (!) 147/109 (BP Location: Right Arm)   Pulse 83   Temp 98.3 F (36.8 C) (Oral)   Resp 16   LMP 08/20/2023   SpO2 98%   Physical Exam Constitutional:      General: She is not in acute distress.    Appearance: Normal appearance. She is well-developed. She is not ill-appearing, toxic-appearing or diaphoretic.  HENT:     Head: Normocephalic and atraumatic.     Nose: Nose normal.     Mouth/Throat:     Mouth: Mucous membranes are moist.  Eyes:     General: No scleral icterus.       Right eye: No discharge.  Left eye: No discharge.     Extraocular Movements: Extraocular movements intact.  Cardiovascular:     Rate and Rhythm: Normal rate.  Pulmonary:     Effort: Pulmonary effort is normal.  Musculoskeletal:     Right knee: Bony tenderness and crepitus present. No swelling, deformity, effusion, erythema, ecchymosis or lacerations. Decreased range of motion. Tenderness present over the medial joint line and patellar tendon. No lateral joint line tenderness. Normal alignment and normal patellar mobility.     Left knee: Bony tenderness and crepitus present. No swelling, deformity, effusion, erythema, ecchymosis or lacerations. Decreased range of motion. Tenderness present over the medial joint line and patellar tendon. No lateral joint line  tenderness. Normal alignment and normal patellar mobility.     Comments: Ambulates gingerly favoring her knees.  Skin:    General: Skin is warm and dry.  Neurological:     General: No focal deficit present.     Mental Status: She is alert and oriented to person, place, and time.  Psychiatric:        Mood and Affect: Mood normal.        Behavior: Behavior normal.     Assessment and Plan :   PDMP not reviewed this encounter.  1. Acute bilateral knee pain    X-ray over-read was pending at time of discharge, recommended follow up with only abnormal results. Otherwise will not call for negative over-read. Patient was in agreement.  Suspect inflammatory source of her knee pain due to overuse, possible bursitis.  As she is failing NSAID treatment, offered an oral prednisone course.  Recommended she take some time off of work as this may be the primary source of her symptoms.  Advised that she follow-up with an orthopedist as well.  Counseled patient on potential for adverse effects with medications prescribed/recommended today, ER and return-to-clinic precautions discussed, patient verbalized understanding.    Wallis Bamberg, [place] 08/25/23 1313

## 2023-08-25 NOTE — Discharge Instructions (Addendum)
I will be using a steroid called prednisone for 5 days.  This has strong anti-inflammatory properties.  This is the primary problem with your needs.  Inflammation comes from overuse as an doing a lot of standing, squatting, lifting, walking.  I suspect this is related to your work and therefore when she did take a few days off.  If your pain continues, follow-up with an orthopedist listed below.  Over the weekend you can do icing 20 minutes on, 2 hours off.  Apply a thin barrier between the ice pack and your skin.   I will reach out to you by MyChart with your x-ray results at some point today.

## 2023-08-25 NOTE — ED Triage Notes (Signed)
Reports chronic knee pain bilaterally. Sunday night reports that her left knee pain began to worsen in severity. States the pain has prevented her from being able to sleep most of the week. Denies any previous or current injury to knee. Took Advil, aleve, tylenol, and epsom salt baths without any relief.

## 2024-01-04 ENCOUNTER — Ambulatory Visit
Admission: EM | Admit: 2024-01-04 | Discharge: 2024-01-04 | Disposition: A | Discharge status: Home / Self Care | Payer: BC Managed Care – PPO | Attending: Family Medicine | Admitting: Family Medicine

## 2024-01-04 DIAGNOSIS — M546 Pain in thoracic spine: Secondary | ICD-10-CM | POA: Insufficient documentation

## 2024-01-04 DIAGNOSIS — R109 Unspecified abdominal pain: Secondary | ICD-10-CM | POA: Insufficient documentation

## 2024-01-04 LAB — POCT URINALYSIS DIP (MANUAL ENTRY)
Bilirubin, UA: NEGATIVE
Glucose, UA: NEGATIVE mg/dL
Ketones, POC UA: NEGATIVE mg/dL
Leukocytes, UA: NEGATIVE
Nitrite, UA: NEGATIVE
Protein Ur, POC: NEGATIVE mg/dL
Spec Grav, UA: 1.02 (ref 1.010–1.025)
Urobilinogen, UA: 0.2 U/dL
pH, UA: 6.5 (ref 5.0–8.0)

## 2024-01-04 MED ORDER — CYCLOBENZAPRINE HCL 5 MG PO TABS: 5.0000 mg | ORAL_TABLET | Freq: Every evening | ORAL | 0 refills | Status: DC | PRN

## 2024-01-04 MED ORDER — NAPROXEN 375 MG PO TABS: 375.0000 mg | ORAL_TABLET | Freq: Two times a day (BID) | ORAL | 0 refills | Status: DC

## 2024-01-04 NOTE — ED Provider Notes (Signed)
 [facility]  Note:  This document was prepared using Conservation officer, historic buildings and may include unintentional dictation errors.  MRN: [id number] DOB: Aug 10, 1991  Subjective:   Katherine Robbins is a 33 y.o. female presenting for 1 day history of mid to right-sided back pain, flank pain.  Pain can be positional.  No urinary symptoms.  No history of kidney stone.  LMP was a week ago.  No current facility-administered medications for this encounter.  Current Outpatient Medications:    Aspirin-Acetaminophen  (GOODYS BODY PAIN PO), Take by mouth., Disp: , Rfl:    amLODipine  (NORVASC ) 5 MG tablet, Take 1 tablet (5 mg total) by mouth daily., Disp: 30 tablet, Rfl: 11   buPROPion  (WELLBUTRIN  XL) 300 MG 24 hr tablet, Take 1 tablet (300 mg total) by mouth every morning., Disp: 30 tablet, Rfl: 0   predniSONE  (DELTASONE ) 20 MG tablet, Take 2 tablets daily with breakfast., Disp: 10 tablet, Rfl: 0   traZODone  (DESYREL ) 100 MG tablet, Take 1 tablet (100 mg total) by mouth at bedtime., Disp: 90 tablet, Rfl: 3   Allergies  Allergen Reactions   Apple Juice Other (See Comments)    Throat and ears itch   Shellfish Allergy Itching    Throat and ear itch    Past Medical History:  Diagnosis Date   Blunt abdominal trauma 06/25/2012   Chlamydia    Infection    UTI   Normal pregnancy 06/25/2012   Normal pregnancy 06/25/2012   Ovarian cyst    S/P cesarean section 07/01/2012   S/P cesarean section 07/01/2012     Past Surgical History:  Procedure Laterality Date   CESAREAN SECTION  07/01/2012   Procedure: CESAREAN SECTION;  Surgeon: Ezzie Marshall, MD;  Location: [facility];  Service: Gynecology;  Laterality: N/A;  Primary cesarean section with delivery of baby boy at 38.   DILATION AND EVACUATION N/A 03/14/2017   Procedure: DILATATION AND EVACUATION;  Surgeon: Lynwood Clubs, MD;  Location: [facility];  Service: Gynecology;  Laterality: N/A;   NO PAST SURGERIES      Family History   Problem Relation Age of Onset   Hypertension Mother    Kidney disease Father    Hypertension Maternal Grandfather    Diabetes Paternal Grandmother    Kidney disease Paternal Grandmother    Anesthesia problems Neg Hx     Social History   Tobacco Use   Smoking status: Every Day    Types: Cigarettes   Smokeless tobacco: Never   Tobacco comments:    Quit 06/21/22  Vaping Use   Vaping status: Every Day  Substance Use Topics   Alcohol use: Yes   Drug use: No    ROS   Objective:   Vitals: BP (!) 159/99 (BP Location: Right Arm)   Pulse 76   Temp 98.1 F (36.7 C) (Oral)   Resp 16   LMP  (Within Weeks) Comment: 1 week  SpO2 98%   Physical Exam Constitutional:      General: She is not in acute distress.    Appearance: Normal appearance. She is well-developed. She is not ill-appearing, toxic-appearing or diaphoretic.  HENT:     Head: Normocephalic and atraumatic.     Nose: Nose normal.     Mouth/Throat:     Mouth: Mucous membranes are moist.     Pharynx: Oropharynx is clear.  Eyes:     General: No scleral icterus.       Right eye: No discharge.  Left eye: No discharge.     Extraocular Movements: Extraocular movements intact.     Conjunctiva/sclera: Conjunctivae normal.  Cardiovascular:     Rate and Rhythm: Normal rate.  Pulmonary:     Effort: Pulmonary effort is normal.  Abdominal:     General: Bowel sounds are normal. There is no distension.     Palpations: Abdomen is soft. There is no mass.     Tenderness: There is no abdominal tenderness. There is no right CVA tenderness, left CVA tenderness, guarding or rebound.  Musculoskeletal:     Thoracic back: Spasms and tenderness present. No swelling, edema, deformity, signs of trauma, lacerations or bony tenderness. Normal range of motion. No scoliosis.     Lumbar back: Spasms and tenderness present. No swelling, edema, deformity, signs of trauma, lacerations or bony tenderness. Normal range of motion. Negative  right straight leg raise test and negative left straight leg raise test. No scoliosis.       Back:  Skin:    General: Skin is warm and dry.  Neurological:     General: No focal deficit present.     Mental Status: She is alert and oriented to person, place, and time.  Psychiatric:        Mood and Affect: Mood normal.        Behavior: Behavior normal.        Thought Content: Thought content normal.        Judgment: Judgment normal.     Results for orders placed or performed during the hospital encounter of 01/04/24 (from the past 24 hours)  POCT urinalysis dipstick     Status: Abnormal   Collection Time: 01/04/24  6:53 PM  Result Value Ref Range   Color, UA yellow yellow   Clarity, UA clear clear   Glucose, UA negative negative mg/dL   Bilirubin, UA negative negative   Ketones, POC UA negative negative mg/dL   Spec Grav, UA 8.979 8.989 - 1.025   Blood, UA moderate (A) negative   pH, UA 6.5 5.0 - 8.0   Protein Ur, POC negative negative mg/dL   Urobilinogen, UA 0.2 0.2 or 1.0 E.U./dL   Nitrite, UA Negative Negative   Leukocytes, UA Negative Negative    Assessment and Plan :   PDMP not reviewed this encounter.  1. Acute right-sided thoracic back pain   2. Right flank pain    Urine culture pending, will manage for musculoskeletal back pain.  Emphasized need to hydrate more consistently.  Use naproxen , muscle relaxant.  Discussed back care.  Counseled patient on potential for adverse effects with medications prescribed/recommended today, ER and return-to-clinic precautions discussed, patient verbalized understanding.    Christopher Savannah, [place] 01/08/24 [phone]

## 2024-01-04 NOTE — Discharge Instructions (Addendum)
Make sure you hydrate very well with plain water and a quantity of 80 ounces of water a day.  Please limit drinks that are considered urinary irritants such as soda, sweet tea, coffee, energy drinks, alcohol.  These can worsen your urinary and genital symptoms but also be the source of them.  I will let you know about your urine culture results through MyChart to see if we need to prescribe or change your antibiotics based off of those results.  

## 2024-01-04 NOTE — ED Triage Notes (Signed)
 Pt reports right flank pain, right sided middle back pain since this morning. Pain is worse with movement.  Goody powder gives no relief.

## 2024-01-05 LAB — URINE CULTURE

## 2024-05-28 ENCOUNTER — Ambulatory Visit: Payer: Self-pay

## 2024-05-28 NOTE — Telephone Encounter (Signed)
 I talked to pt - stated she has taken Trazodone , it worked well for her, needs a refill. She has an appt 7/7 with Dr Toma.

## 2024-05-28 NOTE — Telephone Encounter (Signed)
 FYI Only or Action Required?: FYI only for provider.  Patient was last seen in primary care on 01/06/2023 by Rudy Sieving, MD. Called Nurse Triage reporting Anxiety. Symptoms began x 2 weeks. Interventions attempted: Nothing. Symptoms are: gradually worsening.  Triage Disposition: See PCP When Office is Open (Within 3 Days)  Patient/caregiver understands and will follow disposition?:   ** Pt. Scheduled for 7/8**                         Copied from CRM [id number]. Topic: Clinical - Red Word Triage >> May 28, 2024 11:34 AM Graeme ORN wrote: Red Word that prompted transfer to Nurse Triage: Anxiety attacks at work, high bp, migraines Reason for Disposition  Panic attacks are increasing in frequency  Answer Assessment - Initial Assessment Questions 1. CONCERN: Did anything happen that prompted you to call today?      Anxiety attacks at work  2. ANXIETY SYMPTOMS: Can you describe how you (your loved one; patient) have been feeling? (e.g., tense, restless, panicky, anxious, keyed up, overwhelmed, sense of impending doom).       Panicked and overwhelmed, trouble sleeping  3. ONSET: How long have you been feeling this way? (e.g., hours, days, weeks)     X 2 weeks  4. SEVERITY: How would you rate the level of anxiety? (e.g., 0 - 10; or mild, moderate, severe).     8/10   5. FUNCTIONAL IMPAIRMENT: How have these feelings affected your ability to do daily activities? Have you had more difficulty than usual doing your normal daily activities? (e.g., getting better, same, worse; self-care, school, work, interactions)     Yes, doesn't want to be at work  6. HISTORY: Have you felt this way before? Have you ever been diagnosed with an anxiety problem in the past? (e.g., generalized anxiety disorder, panic attacks, PTSD). If Yes, ask: How was this problem treated? (e.g., medicines, counseling, etc.)     Yes  7. RISK OF HARM - SUICIDAL IDEATION: Do you ever have  thoughts of hurting or killing yourself? If Yes, ask:  Do you have these feelings now? Do you have a plan on how you would do this?     No    8. TREATMENT:  What has been done so far to treat this anxiety? (e.g., medicines, relaxation strategies). What has helped?     Nothing  9. TREATMENT - THERAPIST: Do you have a counselor or therapist? Name?     No          12. OTHER SYMPTOMS: Do you have any other symptoms? (e.g., feeling depressed, trouble concentrating, trouble sleeping, trouble breathing, palpitations or fast heartbeat, chest pain, sweating, nausea, or diarrhea)        Headaches   13. PREGNANCY: Is there any chance you are pregnant? When was your last menstrual period?       No   Suffers from Depression as well.  Protocols used: Anxiety and Panic Attack-A-AH

## 2024-05-30 ENCOUNTER — Other Ambulatory Visit: Payer: Self-pay | Admitting: Internal Medicine

## 2024-05-30 DIAGNOSIS — G4700 Insomnia, unspecified: Secondary | ICD-10-CM

## 2024-05-30 MED ORDER — TRAZODONE HCL 100 MG PO TABS: 100.0000 mg | ORAL_TABLET | Freq: Every day | ORAL | 0 refills | Status: DC

## 2024-05-30 NOTE — Telephone Encounter (Signed)
 Pt was called / informed of Trazodone  refill.

## 2024-06-03 ENCOUNTER — Ambulatory Visit: Payer: Self-pay | Admitting: Student

## 2024-06-03 VITALS — BP 159/118 | HR 85 | Temp 98.8°F | Wt 163.8 lb

## 2024-06-03 DIAGNOSIS — F419 Anxiety disorder, unspecified: Secondary | ICD-10-CM

## 2024-06-03 DIAGNOSIS — F332 Major depressive disorder, recurrent severe without psychotic features: Secondary | ICD-10-CM

## 2024-06-03 DIAGNOSIS — I10 Essential (primary) hypertension: Secondary | ICD-10-CM | POA: Diagnosis not present

## 2024-06-03 DIAGNOSIS — R519 Headache, unspecified: Secondary | ICD-10-CM

## 2024-06-03 DIAGNOSIS — F32A Depression, unspecified: Secondary | ICD-10-CM

## 2024-06-03 DIAGNOSIS — F411 Generalized anxiety disorder: Secondary | ICD-10-CM

## 2024-06-03 DIAGNOSIS — G44209 Tension-type headache, unspecified, not intractable: Secondary | ICD-10-CM

## 2024-06-03 MED ORDER — ESCITALOPRAM OXALATE 10 MG PO TABS: 10.0000 mg | ORAL_TABLET | Freq: Every day | ORAL | 0 refills | Status: DC

## 2024-06-03 MED ORDER — OLMESARTAN MEDOXOMIL-HCTZ 20-12.5 MG PO TABS: 1.0000 | ORAL_TABLET | Freq: Every day | ORAL | 0 refills | Status: DC

## 2024-06-03 NOTE — Patient Instructions (Signed)
 Thank you, Ms.Oletha N Weigelt for allowing us  to provide your care today. Today we discussed:   For your blood pressure  - Check your blood pressures daily, 2 hours after taking blood pressure medication and record on a piece of paper - Please return to clinic in 2 weeks for your symptoms - Start Benicar , take 1 tab by mouth daily   For your anxiety/depression - Start Lexapro  10 mg, take 1 tab by mouth daily  I have ordered the following labs for you:  Lab Orders  No laboratory test(s) ordered today     Tests ordered today:  As above  Referrals ordered today:   Referral Orders         Ambulatory referral to Integrated Behavioral Health       I have ordered the following medication/changed the following medications:   Stop the following medications: Medications Discontinued During This Encounter  Medication Reason   predniSONE  (DELTASONE ) 20 MG tablet    naproxen  (NAPROSYN ) 375 MG tablet    traZODone  (DESYREL ) 100 MG tablet      Start the following medications: Meds ordered this encounter  Medications   olmesartan -hydrochlorothiazide (BENICAR  HCT) 20-12.5 MG tablet    Sig: Take 1 tablet by mouth daily.    Dispense:  30 tablet    Refill:  0   escitalopram  (LEXAPRO ) 10 MG tablet    Sig: Take 1 tablet (10 mg total) by mouth daily.    Dispense:  30 tablet    Refill:  0     Follow up: 2 weeks for BP follow up     Remember:   Should you have any questions or concerns please call the internal medicine clinic at [phone].     Rayann Atway, D.O. [facility]

## 2024-06-03 NOTE — Progress Notes (Unsigned)
   Acute Office Visit  Subjective:     Patient ID: Katherine Robbins, female    DOB: 1991/08/10, 33 y.o.   MRN: [id number]  Chief Complaint  Patient presents with  . Anxiety    Increased anxiety   . Headache    Pain 10/10 started this morning, constant     HPI This is a 33 year old female with past medical history of obesity, hypertension, GAD who presents today as an acute visit for anxiety.  She was last seen at [facility] in 01/06/2023.  ROS    As per assessment and plan Objective:    BP (!) 163/117 (BP Location: Left Arm, Patient Position: Sitting, Cuff Size: Normal)   Pulse (!) 103   Temp 98.8 F (37.1 C) (Oral)   Wt 163 lb 12.8 oz (74.3 kg)   SpO2 99%   BMI 31.99 kg/m  BP Readings from Last 3 Encounters:  06/03/24 (!) 163/117  01/04/24 (!) 159/99  08/25/23 (!) 147/109   Wt Readings from Last 3 Encounters:  06/03/24 163 lb 12.8 oz (74.3 kg)  01/06/23 177 lb 12.8 oz (80.6 kg)  08/19/22 169 lb (76.7 kg)      Physical Exam  No results found for any visits on 06/03/24.      Assessment & Plan:  Discussed with Dr Lovie    Depression/Anxiety  -Per chart review, etiology of GAD seems to be multifactorial, with her insomnia and heavy caffeine use.  Per the last office visit, she was advised lifestyle modification including exercise, meditation, decreasing caffeine intake. -PHQ 9  = 23; GAD 21  - + frstrations, trouble sleeping at night.  - Does not smoke  - Energy drinks, 2 red bulls a day, no coffee. No sodas.  -Current outpatient medication includes Wellbutrin  300 mg daily and trazodone  100 mg daily. -Referral to behavioral health this placed  Migraines - 2 weeks daily, frontal + neck and shoulder, reports no blurry vision, contact. No throbbing sensation  - Aggravated by light and souds. Waking up with a headache.  - Reports nausea - No lacrimation, runny nose, no recvetn illness  - Nose bleeds, 3x last week. 15 minutes. + headache  - OTC: goody bowders,  execdrine extra strength, DB powders   HTN BP Readings from Last 3 Encounters:  06/03/24 (!) 163/117  01/04/24 (!) 159/99  08/25/23 (!) 147/109   - Not taking any BP medication - NO chest pain or LE swelling    Problem List Items Addressed This Visit   None   No orders of the defined types were placed in this encounter.   No follow-ups on file.  Toma Edwards, DO

## 2024-06-04 ENCOUNTER — Ambulatory Visit: Payer: Self-pay | Admitting: Student

## 2024-06-04 LAB — BMP8+ANION GAP
Anion Gap: 17 mmol/L (ref 10.0–18.0)
BUN/Creatinine Ratio: 12 (ref 9–23)
BUN: 9 mg/dL (ref 6–20)
CO2: 19 mmol/L — ABNORMAL LOW (ref 20–29)
Calcium: 9.4 mg/dL (ref 8.7–10.2)
Chloride: 102 mmol/L (ref 96–106)
Creatinine, Ser: 0.77 mg/dL (ref 0.57–1.00)
Glucose: 79 mg/dL (ref 70–99)
Potassium: 3.7 mmol/L (ref 3.5–5.2)
Sodium: 138 mmol/L (ref 134–144)
eGFR: 104 mL/min/1.73 (ref >59)

## 2024-06-04 NOTE — Assessment & Plan Note (Signed)
 Please see assessment and plan for uncontrolled hypertension.

## 2024-06-04 NOTE — Progress Notes (Signed)
 Internal Medicine Clinic Attending  Case discussed with the resident at the time of the visit.  We reviewed the resident's history and exam and pertinent patient test results.  I agree with the assessment, diagnosis, and plan of care documented in the resident's note.

## 2024-06-04 NOTE — Assessment & Plan Note (Signed)
 BP Readings from Last 3 Encounters:  06/03/24 (!) 159/118  01/04/24 (!) 159/99  08/25/23 (!) 147/109   Last office visit 01/06/2023, reports that she is currently not taking any medications. Reports history of chronic migraines, however over the course of two weeks, reports worsening headaches, mostly frontal, consistent with associated neck and shoulder pain. Denies any blurry vision, but reports pain in her eyes. Reports intermittent nausea, no vomiting. NO lacrimation, runny nose, facial numbness, no recent illness. Reports three episodes of nose bleeds last week, lasting for about 15 minutes. Reports that she had severe headache with it. Reports that she has tried over-the-counter Goody powders, Excedrin extra strength, BC powders for headache without much relief.  On exam, she is hunched over, reports frontal headaches.  Blood pressure in office is elevated to 163/117, recheck 159/118.  Per chart review, amlodipine  was last dispensed on 08/19/2022.  Based on patient's symptoms and clinical findings, high suspicion that her frontal headaches are secondary to chronically uncontrolled blood pressures. -Will check a BMP today - Start olmesartan -HCTZ 20-12.5 mg daily -Advised to check blood pressure daily, recorded on a piece of paper -Return to clinic in 2 weeks for blood pressure follow-up and readjustment of medications

## 2024-06-04 NOTE — Assessment & Plan Note (Signed)
 PHQ-9 score of 23; GAD score of 21.  Last office visit 12/2022, patient reports that she is currently not taking any medications at this time.  She is not taking Wellbutrin  or trazodone .  Last dispensed Wellbutrin  08/15/2022 and trazodone  10/14/2022.  Reports that she drinks 2 Red bulls daily, no coffee.  Does not smoke.  Reports symptoms of frustrations, insomnia, anxiety.  Reports that previously, she was on a medication and therapy that worked for her.  Does not have a clear reason why she is not taking her anxiety medications. -Start Lexapro  10 mg daily, patient is advised to take these medications daily -Referral to integrated behavioral health is sent -Will reevaluate at the next office visit

## 2024-06-05 ENCOUNTER — Ambulatory Visit: Payer: Self-pay | Admitting: *Deleted

## 2024-06-05 NOTE — Telephone Encounter (Signed)
  C/o headache not better after starting benicar .  Vomiting yesterday/ last night. Unable to work , had to go home. Tylenol  not helping ease headache. 10/10 pain frontal and back of neck pain. Dizziness at times.  Denies chest discomfort, no difficulty breathing no fever reported. No N/T face or weakness either side of body , no blurred vision.  Patient concerned she has not been able to tolerate working since Monday , has to leave due to pain and difficulty going up and down latter for work. Please adivse if another appt needed for work note for patient.  Recommended ED if vomiting returns. Recommended rest, cool compress to head and to call 911 if sx worsening . Please advise.         FYI Only or Action Required?: Action required by provider: clinical question for provider and update on patient condition.  Patient was last seen in primary care on 06/03/2024 by Heddy Barren, DO.  Called Nurse Triage reporting Headache.  Symptoms began yesterday.  Interventions attempted: OTC medications: tylenol .  Symptoms are: gradually worsening.  Triage Disposition: See HCP Within 4 Hours (Or PCP Triage)  Patient/caregiver understands and will follow disposition?: No, wishes to speak with PCP    Copied from CRM [id number]. Topic: Clinical - Red Word Triage >> Jun 05, 2024 10:58 AM Graeme ORN wrote: Red Word that prompted transfer to Nurse Triage: Took tylenol  as directed. Head will not stop hurting spreading down through neck. Throwing up. Not able to work. Would like to know what else can be done until blood pressure meds get in system Reason for Disposition  [1] Vomiting AND [2] 2 or more times  (Exception: Similar to previously diagnosed migraines.)  Protocols used: Headache-A-AH

## 2024-06-06 ENCOUNTER — Emergency Department (HOSPITAL_COMMUNITY)

## 2024-06-06 ENCOUNTER — Other Ambulatory Visit: Payer: Self-pay

## 2024-06-06 ENCOUNTER — Emergency Department (HOSPITAL_COMMUNITY)
Admission: EM | Admit: 2024-06-06 | Discharge: 2024-06-07 | Disposition: A | Discharge status: Home / Self Care | Attending: Emergency Medicine | Admitting: Emergency Medicine

## 2024-06-06 ENCOUNTER — Encounter (HOSPITAL_COMMUNITY): Payer: Self-pay | Admitting: Emergency Medicine

## 2024-06-06 DIAGNOSIS — R0789 Other chest pain: Secondary | ICD-10-CM | POA: Diagnosis not present

## 2024-06-06 DIAGNOSIS — E876 Hypokalemia: Secondary | ICD-10-CM | POA: Diagnosis not present

## 2024-06-06 DIAGNOSIS — I10 Essential (primary) hypertension: Secondary | ICD-10-CM | POA: Insufficient documentation

## 2024-06-06 DIAGNOSIS — D72829 Elevated white blood cell count, unspecified: Secondary | ICD-10-CM | POA: Insufficient documentation

## 2024-06-06 DIAGNOSIS — R079 Chest pain, unspecified: Secondary | ICD-10-CM | POA: Insufficient documentation

## 2024-06-06 DIAGNOSIS — R519 Headache, unspecified: Secondary | ICD-10-CM | POA: Diagnosis not present

## 2024-06-06 LAB — BASIC METABOLIC PANEL WITH GFR
Anion gap: 15 (ref 5–15)
BUN: 9 mg/dL (ref 6–20)
CO2: 22 mmol/L (ref 22–32)
Calcium: 9.5 mg/dL (ref 8.9–10.3)
Chloride: 100 mmol/L (ref 98–111)
Creatinine, Ser: 0.8 mg/dL (ref 0.44–1.00)
GFR, Estimated: >60 mL/min (ref >60)
Glucose, Bld: 85 mg/dL (ref 70–99)
Potassium: 3.4 mmol/L — ABNORMAL LOW (ref 3.5–5.1)
Sodium: 137 mmol/L (ref 135–145)

## 2024-06-06 LAB — TROPONIN I (HIGH SENSITIVITY): Troponin I (High Sensitivity): 6 ng/L (ref <18)

## 2024-06-06 MED ORDER — DEXAMETHASONE SODIUM PHOSPHATE 10 MG/ML IJ SOLN
10.0000 mg | Freq: Once | INTRAMUSCULAR | Status: AC
Start: 2024-06-06 — End: 2024-06-07
  Administered 2024-06-07: 10 mg via INTRAVENOUS
  Filled 2024-06-06: qty 1

## 2024-06-06 MED ORDER — SODIUM CHLORIDE 0.9 % IV BOLUS
1000.0000 mL | Freq: Once | INTRAVENOUS | Status: AC
  Administered 2024-06-07: 1000 mL via INTRAVENOUS

## 2024-06-06 MED ORDER — KETOROLAC TROMETHAMINE 15 MG/ML IJ SOLN
15.0000 mg | Freq: Once | INTRAMUSCULAR | Status: AC
  Administered 2024-06-07: 15 mg via INTRAVENOUS
  Filled 2024-06-06: qty 1

## 2024-06-06 MED ORDER — METOCLOPRAMIDE HCL 5 MG/ML IJ SOLN
10.0000 mg | Freq: Once | INTRAMUSCULAR | Status: AC
Start: 2024-06-06 — End: 2024-06-07
  Administered 2024-06-07: 10 mg via INTRAVENOUS
  Filled 2024-06-06: qty 2

## 2024-06-06 MED ORDER — DIPHENHYDRAMINE HCL 50 MG/ML IJ SOLN
12.5000 mg | Freq: Once | INTRAMUSCULAR | Status: AC
Start: 2024-06-06 — End: 2024-06-07
  Administered 2024-06-07: 12.5 mg via INTRAVENOUS
  Filled 2024-06-06: qty 1

## 2024-06-06 MED ORDER — MAGNESIUM SULFATE 2 GM/50ML IV SOLN
2.0000 g | Freq: Once | INTRAVENOUS | Status: AC
Start: 2024-06-06 — End: 2024-06-07
  Administered 2024-06-07: 2 g via INTRAVENOUS
  Filled 2024-06-06: qty 50

## 2024-06-06 NOTE — ED Provider Notes (Signed)
 [place] EMERGENCY DEPARTMENT AT [facility] Provider Note   CSN: [id number] Arrival date & time: 06/06/24  2103     Patient presents with: Chest Pain, Hypertension, and Headache   Katherine Robbins is a 33 y.o. female.   33 year old female presents with complaint of headache x 2 weeks, no relief with Tylenol , denies similar headaches previously. Frontal in location, described as tight. Associated with nausea and vomiting (onset yesterday), had a nosebleed while vomiting at one point yesterday.  Also reports right chest pain, radiates slightly to back, onset yesterday. SHOB in triage note, pt states she has not had SHOB really, just some while walking into the ER tonight but none since. No changes in vision, denies unilateral weakness or numbness. Went to PCP for same, started on BP meds.        Prior to Admission medications   Medication Sig Start Date End Date Taking? Authorizing Provider  escitalopram  (LEXAPRO ) 10 MG tablet Take 1 tablet (10 mg total) by mouth daily. 06/03/24   Tawkaliyar, Roya, DO  olmesartan -hydrochlorothiazide (BENICAR  HCT) 20-12.5 MG tablet Take 1 tablet by mouth daily. 06/03/24   Tawkaliyar, Roya, DO    Allergies: Apple juice and Shellfish allergy    Review of Systems Negative except as per HPI Updated Vital Signs BP (!) 124/91   Pulse 83   Temp (!) 97.4 F (36.3 C) (Oral)   Resp 17   Wt 74.3 kg   SpO2 98%   BMI 31.99 kg/m   Physical Exam Vitals and nursing note reviewed.  Constitutional:      General: She is not in acute distress.    Appearance: She is well-developed. She is not diaphoretic.  HENT:     Head: Normocephalic and atraumatic.  Eyes:     Extraocular Movements: Extraocular movements intact.     Pupils: Pupils are equal, round, and reactive to light.  Cardiovascular:     Rate and Rhythm: Normal rate and regular rhythm.     Heart sounds: Normal heart sounds.  Pulmonary:     Effort: Pulmonary effort is normal.     Breath  sounds: Normal breath sounds.  Abdominal:     Palpations: Abdomen is soft.     Tenderness: There is no abdominal tenderness.  Musculoskeletal:     Cervical back: Neck supple.     Right lower leg: No tenderness. No edema.     Left lower leg: No tenderness. No edema.  Skin:    General: Skin is warm and dry.     Findings: No erythema or rash.  Neurological:     Mental Status: She is alert and oriented to person, place, and time.     GCS: GCS eye subscore is 4. GCS verbal subscore is 5. GCS motor subscore is 6.     Cranial Nerves: No cranial nerve deficit.  Psychiatric:        Behavior: Behavior normal.     (all labs ordered are listed, but only abnormal results are displayed) Labs Reviewed  BASIC METABOLIC PANEL WITH GFR - Abnormal; Notable for the following components:      Result Value   Potassium 3.4 (*)    All other components within normal limits  CBC - Abnormal; Notable for the following components:   WBC 12.6 (*)    Hemoglobin 15.3 (*)    All other components within normal limits  HCG, SERUM, QUALITATIVE  BRAIN NATRIURETIC PEPTIDE  TROPONIN I (HIGH SENSITIVITY)  TROPONIN I (HIGH  SENSITIVITY)    EKG: None  Radiology: DG Chest 2 View Result Date: 06/06/2024 CLINICAL DATA:  Chest pain EXAM: CHEST - 2 VIEW COMPARISON:  None Available. FINDINGS: The heart size and mediastinal contours are within normal limits. Both lungs are clear. The visualized skeletal structures are unremarkable. IMPRESSION: No active cardiopulmonary disease. Electronically Signed   By: Franky Crease M.D.   On: 06/06/2024 22:24   CT Head Wo Contrast Result Date: 06/06/2024 CLINICAL DATA:  Headache EXAM: CT HEAD WITHOUT CONTRAST TECHNIQUE: Contiguous axial images were obtained from the base of the skull through the vertex without intravenous contrast. RADIATION DOSE REDUCTION: This exam was performed according to the departmental dose-optimization program which includes automated exposure control,  adjustment of the mA and/or kV according to patient size and/or use of iterative reconstruction technique. COMPARISON:  None Available. FINDINGS: Brain: No acute intracranial abnormality. Specifically, no hemorrhage, hydrocephalus, mass lesion, acute infarction, or significant intracranial injury. Vascular: No hyperdense vessel or unexpected calcification. Skull: No acute calvarial abnormality. Sinuses/Orbits: No acute findings Other: None IMPRESSION: Normal study. Electronically Signed   By: Franky Crease M.D.   On: 06/06/2024 21:44     Procedures   Medications Ordered in the ED  sodium chloride  0.9 % bolus 1,000 mL (0 mLs Intravenous Stopped 06/07/24 0134)  ketorolac  (TORADOL ) 15 MG/ML injection 15 mg (15 mg Intravenous Given 06/07/24 0022)  metoCLOPramide  (REGLAN ) injection 10 mg (10 mg Intravenous Given 06/07/24 0023)  diphenhydrAMINE  (BENADRYL ) injection 12.5 mg (12.5 mg Intravenous Given 06/07/24 0024)  dexamethasone  (DECADRON ) injection 10 mg (10 mg Intravenous Given 06/07/24 0021)  magnesium  sulfate IVPB 2 g 50 mL (0 g Intravenous Stopped 06/07/24 0134)  potassium chloride  SA (KLOR-CON  M) CR tablet 40 mEq (40 mEq Oral Given 06/07/24 0134)                                    Medical Decision Making Amount and/or Complexity of Data Reviewed Labs: ordered. Radiology: ordered.  Risk Prescription drug management.   This patient presents to the ED for concern of headache, CP, this involves an extensive number of treatment options, and is a complaint that carries with it a high risk of complications and morbidity.  The differential diagnosis includes but not limited to HTN emergency, electrolyte/metabolic, migraine    Co morbidities / Chronic conditions that complicate the patient evaluation  HTN, anxiety    Additional history obtained:  Additional history obtained from EMR External records from outside source obtained and reviewed including prior labs on file   Lab Tests:  I  Ordered, and personally interpreted labs.  The pertinent results include:  troponin x 2 negative. Hcg negative. BNP WNL. CBC and BMP with non specific leukocytosis, mild hypokalemia.    Imaging Studies ordered:  I ordered imaging studies including CXR, CT head  I independently visualized and interpreted imaging which showed no acute process  I agree with the radiologist interpretation   Problem List / ED Course / Critical interventions / Medication management  33 year old female with hx of HTN, recently started on meds with PCP presents with headache and symptoms as above. Neuro exam normal, lungs CTA, no lower extremity edema. Labs and imaging reassuring. Symptoms improved with meds provided, BP improved. Dc home with plan to monitor BP and follow up with PCP  I ordered medication including IVF, magnesium , Toradol , decadron , reglan , benadryl  Reevaluation of the patient after these medicines  showed that the patient headache and BP improved  I have reviewed the patients home medicines and have made adjustments as needed   Social Determinants of Health:  Has PCP   Test / Admission - Considered:  Stable for dc      Final diagnoses:  Hypertension, unspecified type  Acute nonintractable headache, unspecified headache type  Chest pain, unspecified type  Hypokalemia    ED Discharge Orders     None          Beverley Leita DELENA DEVONNA 06/07/24 0515    Mesner, Selinda, MD 06/18/24 [phone]

## 2024-06-06 NOTE — Telephone Encounter (Signed)
 Call to patient to check to see how she is doing and if she still plans to go to the ER.  Unable to reach patient or to leave a voice mail that the Clinics had called.

## 2024-06-06 NOTE — ED Triage Notes (Signed)
 Patient c/o chest pain and sob that has been going on all day, htn since yesterday and headaches x 2 weeks.  Patient started taking medication yesterday for htn.  Patient gives verbal consent for MSE.

## 2024-06-06 NOTE — ED Notes (Signed)
 Unsuccessful IV start. IV team order in.

## 2024-06-07 ENCOUNTER — Ambulatory Visit: Payer: Self-pay

## 2024-06-07 LAB — CBC
HCT: 45.3 % (ref 36.0–46.0)
Hemoglobin: 15.3 g/dL — ABNORMAL HIGH (ref 12.0–15.0)
MCH: 30.9 pg (ref 26.0–34.0)
MCHC: 33.8 g/dL (ref 30.0–36.0)
MCV: 91.5 fL (ref 80.0–100.0)
Platelets: 265 K/uL (ref 150–400)
RBC: 4.95 MIL/uL (ref 3.87–5.11)
RDW: 12.7 % (ref 11.5–15.5)
WBC: 12.6 K/uL — ABNORMAL HIGH (ref 4.0–10.5)
nRBC: 0 % (ref 0.0–0.2)

## 2024-06-07 LAB — TROPONIN I (HIGH SENSITIVITY): Troponin I (High Sensitivity): 5 ng/L (ref <18)

## 2024-06-07 LAB — HCG, SERUM, QUALITATIVE: Preg, Serum: NEGATIVE

## 2024-06-07 LAB — BRAIN NATRIURETIC PEPTIDE: B Natriuretic Peptide: 22.3 pg/mL (ref 0.0–100.0)

## 2024-06-07 MED ORDER — POTASSIUM CHLORIDE CRYS ER 20 MEQ PO TBCR
40.0000 meq | EXTENDED_RELEASE_TABLET | Freq: Once | ORAL | Status: AC
  Administered 2024-06-07: 40 meq via ORAL
  Filled 2024-06-07: qty 2

## 2024-06-07 NOTE — Telephone Encounter (Signed)
 Attempt X1 to call pt regarding concerns for blood pressure.   No response noted.  Left discrete VM appropriately.      ------------------------------------------------------ Blood pressure: 159/116. Patient states she has been having high blood pressure: Chest pain yesterday, patient is still having headaches and current medication isn't helping she has also been vomiting.

## 2024-06-07 NOTE — Telephone Encounter (Signed)
 Reason for Disposition . [1] Systolic BP >= 160 OR Diastolic >= 100 AND [2] cardiac (e.g., breathing difficulty, chest pain) or neurologic symptoms (e.g., new-onset blurred or double vision, unsteady gait)  Answer Assessment - Initial Assessment Questions 1. BLOOD PRESSURE: What is your blood pressure? Did you take at least two measurements 5 minutes apart?     Elevated BP- not controled 2. ONSET: When did you take your blood pressure?     Was seen at ED yesterday- frequent migraine headaches- non stop- makes BP high 3. HOW: How did you take your blood pressure? (e.g., automatic home BP monitor, visiting nurse)     BP- 159/116- this am 4. HISTORY: Do you have a history of high blood pressure?     yes 5. MEDICINES: Are you taking any medicines for blood pressure? Have you missed any doses recently?     yes 6. OTHER SYMPTOMS: Do you have any symptoms? (e.g., blurred vision, chest pain, difficulty breathing, headache, weakness)     headache Patient was seen at ED- was treated for migraine, vomiting  Protocols used: Blood Pressure - High-A-AH

## 2024-06-07 NOTE — Telephone Encounter (Signed)
 Note closed inadvertently- Call to CAL- office closes at 12pm today- no way she can get her in- advised ED for symptoms- appointment has been scheduled for Monday

## 2024-06-07 NOTE — Discharge Instructions (Addendum)
 Follow up with your primary care provider for recheck. Return to the ER for worsening or concerning symptoms.   You can try taking Magnesium  Oxide for headaches. Call your doctor in the morning to discuss your blood pressure and your medication.

## 2024-06-07 NOTE — Telephone Encounter (Signed)
 FYI Only or Action Required?: FYI only for provider.  Patient was last seen in primary care on 06/03/2024 by Heddy Barren, DO.  Called Nurse Triage reporting Hypertension.  Symptoms began several days ago.  Interventions attempted: Other: Patient has been seen in ED.  Symptoms are: unchanged.  Triage Disposition: Go to ED Now (Notify PCP)  Patient/caregiver understands and will follow disposition?: Yes

## 2024-06-10 ENCOUNTER — Other Ambulatory Visit: Payer: Self-pay

## 2024-06-10 ENCOUNTER — Ambulatory Visit: Admitting: Internal Medicine

## 2024-06-10 VITALS — BP 165/101 | HR 75 | Temp 98.4°F | Ht 60.0 in | Wt 159.8 lb

## 2024-06-10 DIAGNOSIS — F329 Major depressive disorder, single episode, unspecified: Secondary | ICD-10-CM

## 2024-06-10 DIAGNOSIS — I10 Essential (primary) hypertension: Secondary | ICD-10-CM | POA: Diagnosis not present

## 2024-06-10 DIAGNOSIS — R519 Headache, unspecified: Secondary | ICD-10-CM

## 2024-06-10 DIAGNOSIS — G44209 Tension-type headache, unspecified, not intractable: Secondary | ICD-10-CM

## 2024-06-10 DIAGNOSIS — F332 Major depressive disorder, recurrent severe without psychotic features: Secondary | ICD-10-CM

## 2024-06-10 MED ORDER — OLMESARTAN MEDOXOMIL-HCTZ 40-25 MG PO TABS: 1.0000 | ORAL_TABLET | Freq: Every day | ORAL | 3 refills | Status: AC

## 2024-06-10 MED ORDER — SUMATRIPTAN SUCCINATE 50 MG PO TABS: 50.0000 mg | ORAL_TABLET | Freq: Once | ORAL | 2 refills | Status: AC | PRN

## 2024-06-10 NOTE — Assessment & Plan Note (Signed)
-   Difficult to tease out any improvements in mood because the headaches have been so bad - Continue Lexapro  10mg  daily - Plan for repeat PHQ9 and GAD7 at next visit in 3 weeks - VBCI referral for BHT is still pending

## 2024-06-10 NOTE — Assessment & Plan Note (Signed)
-   Blood pressure still very high today x3 - Increase Olmesartan  20 / hydrochlorothiazide 12.5mg  daily to Olmesartan  40mg  and hydrochlorothiazide 25mg  daily starting today - Follow up in 3 weeks for HTN (get BMP then) and mood - Continue BP log at home

## 2024-06-10 NOTE — Patient Instructions (Signed)
 Thank you, Katherine Robbins for allowing us  to provide your care today. Today we discussed your headaches, your blood pressure, and your mood.  I have ordered the following labs for you:  Lab Orders  No laboratory test(s) ordered today      Referrals ordered today:   Referral Orders  No referral(s) requested today     I have ordered the following medication/changed the following medications:   Stop the following medications: Medications Discontinued During This Encounter  Medication Reason   olmesartan -hydrochlorothiazide (BENICAR  HCT) 20-12.5 MG tablet      Start the following medications: Meds ordered this encounter  Medications   SUMAtriptan  (IMITREX ) 50 MG tablet    Sig: Take 1 tablet (50 mg total) by mouth once as needed for migraine. May repeat in 2 hours if headache persists or recurs.    Dispense:  30 tablet    Refill:  2   olmesartan -hydrochlorothiazide (BENICAR  HCT) 40-25 MG tablet    Sig: Take 1 tablet by mouth daily.    Dispense:  90 tablet    Refill:  3     You may also take over the counter magnesium  oxide for your headaches. One possible side effect is diarrhea.   Return in about 3 weeks (around 07/01/2024) for Blood pressure, headaches, phq9 & gad7.    Remember:  - Try to drink plenty of water - Keep checking your blood pressure at home   Should you have any questions or concerns please call the internal medicine clinic at [phone].     Norina Cowper, MD Faculty, Internal Medicine Teaching Progam [facility]

## 2024-06-10 NOTE — Assessment & Plan Note (Signed)
 I think headaches are most likely related to her uncontrolled hypertension, so I want to improve her blood pressure. Given the associated nausea, she may have a component of migraine headache, so I'd like to do a short trial of sumatriptan  to see if this is helpful. - Treat HTN - Add Sumatriptan , 50mg  once as needed for severe headache - The ER advised patient she could try OTC magnesium  as well, and she will try this - Encouraged good hydration

## 2024-06-10 NOTE — Progress Notes (Unsigned)
 [facility]: Clinic Note  Subjective:  History of Present Illness: Katherine Robbins is a 33 y.o. year old female who presents for an acute visit for ER follow up after recent presentation for headaches and blood pressure.  She saw Dr. Toma on 06/03/2024 in clinic and I staffed that visit. At that time, she was having intermittent episodes of severe headaches, with pain worse on bilateral frontal head. In the office, her blood pressure was 163/117.   Dr. Roya also started her on Lexapro  10mg  daily for anxiety and depression.   Ms Lahey started taking the medicine and has been checking her blood pressures regularly at home. They are still very high in 160s/100s ranges. She is still having severe headaches, associated with nausea and nose bleeds. Tylenol  and Excedrin have not been helpful. She went to the ER for this earlier last week.   Please refer to Assessment and Plan below for full details in Problem-Based Charting.   Past Medical History:  Patient Active Problem List   Diagnosis Date Noted   Vaginal discharge 01/06/2023   Headache 08/19/2022   Anxiety and depression 08/19/2022   Insomnia 08/05/2022   Bacterial vaginosis 07/06/2022   Obesity (BMI 30-39.9) 06/02/2022   Uncontrolled hypertension 06/02/2022   Breast lump in upper inner quadrant 12/09/2021   Healthcare maintenance 06/09/2021   Major depression 06/09/2021      Medications:  Current Outpatient Medications:    olmesartan -hydrochlorothiazide (BENICAR  HCT) 40-25 MG tablet, Take 1 tablet by mouth daily., Disp: 90 tablet, Rfl: 3   SUMAtriptan  (IMITREX ) 50 MG tablet, Take 1 tablet (50 mg total) by mouth once as needed for migraine. May repeat in 2 hours if headache persists or recurs., Disp: 30 tablet, Rfl: 2   escitalopram  (LEXAPRO ) 10 MG tablet, Take 1 tablet (10 mg total) by mouth daily., Disp: 30 tablet, Rfl: 0   Allergies: Allergies  Allergen Reactions   Apple Juice Other (See Comments)     Throat and ears itch   Shellfish Allergy Itching    Throat and ear itch       Objective:   Vitals: Vitals:   06/10/24 1126 06/10/24 1130  BP: (!) 157/102 (!) 168/10  Pulse: 75 78  Temp: 98.4 F (36.9 C)   SpO2: 99%      Physical Exam: Physical Exam Constitutional:      Comments: Sitting in chair, appears uncomfortable  Pulmonary:     Effort: Pulmonary effort is normal.     Breath sounds: Normal breath sounds.  Neurological:     Mental Status: She is alert. Mental status is at baseline.  Psychiatric:        Mood and Affect: Mood normal.        Behavior: Behavior normal.      Data: Labs, imaging, and micro were reviewed in Epic. Refer to Assessment and Plan below for full details in Problem-Based Charting.  Assessment & Plan:  Headache I think headaches are most likely related to her uncontrolled hypertension, so I want to improve her blood pressure. Given the associated nausea, she may have a component of migraine headache, so I'd like to do a short trial of sumatriptan  to see if this is helpful. - Treat HTN - Add Sumatriptan , 50mg  once as needed for severe headache - The ER advised patient she could try OTC magnesium  as well, and she will try this - Encouraged good hydration  Uncontrolled hypertension - Blood pressure still very high today x3 - Increase  Olmesartan  20 / hydrochlorothiazide 12.5mg  daily to Olmesartan  40mg  and hydrochlorothiazide 25mg  daily starting today - Follow up in 3 weeks for HTN (get BMP then) and mood - Continue BP log at home  Major depression - Difficult to tease out any improvements in mood because the headaches have been so bad - Continue Lexapro  10mg  daily - Plan for repeat PHQ9 and GAD7 at next visit in 3 weeks - VBCI referral for BHT is still pending     Patient will follow up in 3 weeks for her HTN, a BMP, and repeat PHQ9 & GAD7. She will call the clinic if headaches worsen or if she develops new symptoms.   Raegen Tarpley, MD

## 2024-06-11 ENCOUNTER — Telehealth: Payer: Self-pay | Admitting: *Deleted

## 2024-06-11 NOTE — Progress Notes (Unsigned)
 Complex Care Management Note Care Guide Note  06/11/2024 Name: Katherine Robbins MRN: [id number] DOB: September 20, 1991   Complex Care Management Outreach Attempts: An unsuccessful telephone outreach was attempted today to offer the patient information about available complex care management services.  Follow Up Plan:  Additional outreach attempts will be made to offer the patient complex care management information and services.   Encounter Outcome:  No Answer  Harlene Satterfield  [facility]  [facility], [facility] Guide  Direct Dial: [phone]  Fax [phone]

## 2024-06-12 NOTE — Progress Notes (Signed)
 Complex Care Management Note  Care Guide Note 06/12/2024 Name: Katherine Robbins MRN: [id number] DOB: 1990/12/16  Katherine Robbins is a 33 y.o. year old female who sees Bernadine Manos, MD for primary care. I reached out to Katherine Robbins by phone today to offer complex care management services.  Ms. Summerfield was given information about Complex Care Management services today including:   The Complex Care Management services include support from the care team which includes your Nurse Care Manager, Clinical Social Worker, or Pharmacist.  The Complex Care Management team is here to help remove barriers to the health concerns and goals most important to you. Complex Care Management services are voluntary, and the patient may decline or stop services at any time by request to their care team member.   Complex Care Management Consent Status: Patient agreed to services and verbal consent obtained.   Follow up plan:  Telephone appointment with complex care management team member scheduled for:  7/22  Encounter Outcome:  Patient Scheduled  Harlene Satterfield  [facility]  [facility], [facility] Guide  Direct Dial: [phone]  Fax [phone]

## 2024-06-17 ENCOUNTER — Encounter: Admitting: Student

## 2024-06-18 ENCOUNTER — Other Ambulatory Visit: Payer: Self-pay

## 2024-06-18 ENCOUNTER — Other Ambulatory Visit: Payer: Self-pay | Admitting: Licensed Clinical Social Worker

## 2024-06-18 NOTE — Patient Instructions (Signed)
 Visit Information  Thank you for taking time to visit with me today. Please don't hesitate to contact me if I can be of assistance to you before our next scheduled appointment.  Our next appointment is by telephone on 07/30/24 at 1:00 PM    Please call the care guide team at [phone] if you need to cancel or reschedule your appointment.   Following is a copy of your care plan:   Goals Addressed             This Visit's Progress    VBCI Social Work Care Plan       Problems:   Biomedical scientist             Mental Health Needs:  Depression issues; anxiety issues             Headaches (pain issues , severe)             Decreased family support             Weight loss over last 6 weeks  CSW Clinical Goal(s):   Over the next 30  days the Patient will attend all scheduled medical appointments as evidenced by patient report and care team review of appointment completion in electronic medical record.              Over the next 30 days, client will call [facility] [phone]) to inquire about possible counseling support for [name] patient report of contact with [facility]               Interventions:  Spoke with client about medication procurement. She said she is now taking Lexapro  as prescribed. She is hoping this will help with anxiety and stress issues faced              Discussed job of client. She works as Designer, fashion/clothing. She spoke of job related stress issues.  She spoke with LCSW about FMLA benefit. She said she is looking into FMLA benefit and forms she would need to complete at work to request FMLA benefit for herself              Discussed family support. She said she has reduced family support             Discussed residence of client. She lives in local apartment with her 12 year old son. Discussed food procurement. She said she and her son have adequate food supply            Completed  SDOH assessments. Completed with client [facility] 2/9 and GAD-7             Talked with Katherine Robbins about counseling support. Gave client the name and phone number of [facility], [address], [place], [place] [phone]).  Encouraged client to call that counseling center to discuss her insurance, her needs, copays required and to see if agency was accepting new clients for counseling support)               Discussed pain issues of client. She said she has BP issues and is working with PCP office to manage BP issues. She has severe headaches and pain issues. She said she has severe headaches frequently.              Discussed transport needs. She is driving her vehicle as needed. Discussed sleep issues. She  has reduced sleep, per client. Discussed weight issues. She said she thought she had lost about 20 pounds in the last 6 weeks. She said she has reduced appetite. She said due to headaches and pain issues it is sometimes hard for her to concentrate              Discussed program support with RN, LCSW, Pharmacist             Provided counseling support for client. Used Active Listening Techniques to hear client needs and allow her to vent about problem areas she faces             Talked with client about stress reduction. She likes to listen to music, she likes to watch TV to relax              Encouraged client to communicate as needed with PCP for medical support               Encouraged client to call LCSW as needed for SW support at [phone]               Client was appreciative of call from LCSW today              Patient Goals/Self-Care Activities:  Call [facility] to discuss counseling support options for client               Continue taking your medication as prescribed.                 Attend medical appointments as scheduled               Call LCSW as needed for SW support               Research FMLA documents to complete for  FMLA request at work               Allow time for stress reduction activities such as listening to music, watching TV or spending quality time with her son  Plan:   Telephone follow up appointment with care management team member scheduled for:  07/30/24 at 1:00 PM         Please go to [facility] [address] [place] [phone]) if you are experiencing a Mental Health or Behavioral Health Crisis or need someone to talk to.  The patient verbalized understanding of instructions, educational materials, and care plan provided today and DECLINED offer to receive copy of patient instructions, educational materials, and care plan.    Katherine Robbins  MSW, LCSW [facility] Licensed Clinical Social Worker Direct Dial:  [phone] Fax:  [phone] Website:  [email or web address]

## 2024-06-18 NOTE — Patient Outreach (Signed)
 Complex Care Management   Visit Note  06/18/2024  Name:  Katherine Robbins MRN: [id number] DOB: 1991/09/15  Situation: Referral received for Complex Care Management related to mental health needs, counseling needs I obtained verbal consent from Patient.  Visit completed with patient  on the phone  Background:   Past Medical History:  Diagnosis Date   Blunt abdominal trauma 06/25/2012   Chlamydia    Infection    UTI   Normal pregnancy 06/25/2012   Normal pregnancy 06/25/2012   Ovarian cyst    S/P cesarean section 07/01/2012   S/P cesarean section 07/01/2012    Assessment: Patient Reported Symptoms:  Cognitive Cognitive Status: Alert and oriented to person, place, and time Cognitive/Intellectual Conditions Management [RPT]: None reported or documented in medical history or problem list   Health Maintenance Behaviors: Sleep adequate, Social activities, Stress management  Neurological Neurological Review of Symptoms: Headaches, Weakness Neurological Management Strategies: Adequate rest, Coping strategies, Counseling  HEENT HEENT Symptoms Reported: Nosebleed HEENT Management Strategies: Adequate rest, Coping strategies    Cardiovascular Cardiovascular Symptoms Reported: Fatigue, Dizziness Does patient have uncontrolled Hypertension?: Yes Cardiovascular Management Strategies: Coping strategies, Adequate rest Weight:  (patient feels that she has lost about 20 pounds in last 6 weeks)  Respiratory Respiratory Symptoms Reported: No symptoms reported Respiratory Management Strategies: Coping strategies, Adequate rest  Endocrine Endocrine Symptoms Reported: Weakness or fatigue, Weight loss, Nausea or vomiting Is patient diabetic?: No    Gastrointestinal Gastrointestinal Symptoms Reported: Nausea, Vomiting Additional Gastrointestinal Details: severe headaches with occasional nausea Gastrointestinal Management Strategies: Adequate rest, Coping strategies    Genitourinary Genitourinary  Symptoms Reported: No symptoms reported Genitourinary Management Strategies: Adequate rest, Coping strategies  Integumentary Integumentary Symptoms Reported: No symptoms reported Skin Management Strategies: Adequate rest, Coping strategies  Musculoskeletal Musculoskelatal Symptoms Reviewed: Weakness, Muscle pain Musculoskeletal Management Strategies: Coping strategies, Adequate rest  Occasional Dizziness, Shoulder pain related to headaches    Psychosocial Psychosocial Symptoms Reported: Anxiety - if selected complete GAD, Sadness - if selected complete PHQ 2-9, Depression - if selected complete PHQ 2-9 Additional Psychological Details: Stress issus related to her job Behavioral Management Strategies: Adequate rest, Coping strategies Major Change/Loss/Stressor/Fears (CP): Medical condition, self Techniques to Cope with Loss/Stress/Change: Counseling, Diversional activities Quality of Family Relationships: unable to assess Do you feel physically threatened by others?: No      06/18/2024   11:49 AM  Depression screen PHQ 2/9  Decreased Interest 1  Down, Depressed, Hopeless 1  PHQ - 2 Score 2  Altered sleeping 2  Tired, decreased energy 1  Change in appetite 1  Feeling bad or failure about yourself  1  Trouble concentrating 1  Moving slowly or fidgety/restless 1  Suicidal thoughts 0  PHQ-9 Score 9  Difficult doing work/chores Somewhat difficult    Vitals:   Client said her BP readings have been high. She is communicating as needed with PCP provider to monitor BP readings of client. She has BP cuff at home she uses  Medications Reviewed Today     Reviewed by Frances Ozell GORMAN KEN (Social Worker) on 06/18/24 at 1134  Med List Status: <None>   Medication Order Taking? Sig Documenting Provider Last Dose Status Informant  escitalopram  (LEXAPRO ) 10 MG tablet [id number] Yes Take 1 tablet (10 mg total) by mouth daily. Tawkaliyar, Roya, DO  Active   olmesartan -hydrochlorothiazide  (BENICAR  HCT) 40-25 MG tablet [id number] Yes Take 1 tablet by mouth daily. Lovie Clarity, MD  Active   SUMAtriptan  (IMITREX ) 50 MG tablet [id number] Yes  Take 1 tablet (50 mg total) by mouth once as needed for migraine. May repeat in 2 hours if headache persists or recurs. Lovie Clarity, MD  Active             Recommendation:   PCP Follow-up Continue Current Plan of Care Take medications as prescribed Call [facility] to inquire about possible counseling support services for client Call LCSW as needed for SW support  Follow Up Plan:   LCSW  to call client on 07/30/24 at 1:00 PM    Glendia Pear  MSW, LCSW [place]/Value Based Care [facility] Licensed Clinical Social Worker Direct Dial:  [phone] Fax:  [phone] Website:  [email or web address]

## 2024-06-25 ENCOUNTER — Other Ambulatory Visit: Payer: Self-pay | Admitting: Student

## 2024-06-25 DIAGNOSIS — F419 Anxiety disorder, unspecified: Secondary | ICD-10-CM

## 2024-07-01 ENCOUNTER — Other Ambulatory Visit: Payer: Self-pay

## 2024-07-01 ENCOUNTER — Ambulatory Visit

## 2024-07-01 VITALS — BP 117/77 | HR 77 | Temp 98.2°F | Ht 60.0 in | Wt 159.6 lb

## 2024-07-01 DIAGNOSIS — R519 Headache, unspecified: Secondary | ICD-10-CM | POA: Diagnosis not present

## 2024-07-01 DIAGNOSIS — I10 Essential (primary) hypertension: Secondary | ICD-10-CM

## 2024-07-01 DIAGNOSIS — F332 Major depressive disorder, recurrent severe without psychotic features: Secondary | ICD-10-CM

## 2024-07-01 MED ORDER — ESCITALOPRAM OXALATE 20 MG PO TABS: 20.0000 mg | ORAL_TABLET | Freq: Every day | ORAL | 2 refills | Status: DC

## 2024-07-01 NOTE — Assessment & Plan Note (Addendum)
 BP today significantly improved to 117/77.  Home BPs with systolic blood pressures in the 110s to 120s range. Patient reports excellent compliance with olmesartan  40 and hydrochlorothiazide 25.  She does have some dryness in her mouth which may be because of the diuretic portion of her medication.  She was advised to maintain adequate hydration.  - Continue Olmesartan  40-Hydrochlorothiazide 25 - BMP and magnesium  to monitor Creatinine and electrolytes after increasing the above medication.

## 2024-07-01 NOTE — Assessment & Plan Note (Signed)
 Her headaches have been decreasing in frequency since her last visit.  Her headache is mostly a bandlike distribution, worse in the front.  She has associated bright flashes before she has a headache.  She also has photophobia.  She denies nausea/vomiting. Her symptoms are not classic for any 1 particular headache type, however I believe that with her photophobia, aura-like symptoms, and improvement with sumatriptan , her headaches may be migraine type. She has been using Tylenol  and sumatriptan  for headaches.  She normally uses Tylenol  as an initial agent, and has used sumatriptan  50 to 100 mg for uncontrolled headaches with Tylenol .  She has used sumatriptan  roughly 3 times per week, and it has worked every time.  She is pleased with her current level of control of her headaches. She was advised to try to limit her use of Tylenol  and sumatriptan  to 3 times per week to avoid medication withdrawal headache.  -Continue with Tylenol  and sumatriptan .

## 2024-07-01 NOTE — Assessment & Plan Note (Addendum)
 PHQ-9 17 today, increased from 10-12 at her previous last 2 visits.  GAD 21 today, increased from 8 last visit.  She states that her symptoms are somewhat unchanged from her last visit, and that her symptoms are impacting her in her day-to-day activities.  She would like to trial an increased dose of Lexapro .  She is currently taking 10 mg.  She received a call from a caseworker, however she has not been established with a counseling practice.  She was provided with the number for [facility] [phone]) to set up an appointment for counseling.   - Increase Lexapro  dose to 20 mg once daily, she may continue to take two 10 mg tablets until she runs out of the medication and then will pick up the 20 mg dose after she is done with the medication she has.

## 2024-07-01 NOTE — Patient Instructions (Signed)
 Thank you, Katherine Robbins, for allowing us  to provide your care today. Today we discussed . . .  > Headaches       - We discussed that your headaches are better controlled now that you are using the Tylenol  and Sumatriptan .  We would like to repeat encourage you to only use these medications 3 times a week if possible so that you do not have a medication withdrawal headache. > Blood pressure       - Your blood pressure today is well-controlled with the olmesartan -hydrochlorothiazide medication that you are taking.  Please continue taking this medication daily.  We will also get labs today to look at your electrolytes and kidney functions.  I will call you with these results.  Please try and stay hydrated. > Depression/anxiety       - I have increased your Lexapro  dose to 20 mg once daily.  Since you already have 10 mg pills, you may take 2 of those every day until you finish the 10 mg pills.  After that you can start taking the 20 mg pills.  Please call the number for counseling [phone] to schedule your appointment.   I have ordered the following labs for you:   Lab Orders         Basic metabolic panel with GFR         Magnesium        Referrals ordered today:   Referral Orders  No referral(s) requested today      Follow up: 6-8 weeks to discuss your medications, headaches, blood pressure, and mental health    Remember:  Should you have any questions or concerns please call the internal medicine clinic at [phone].     Katherine Robbins, [facility]

## 2024-07-01 NOTE — Progress Notes (Signed)
 CC: Routine Follow Up for headaches, BP, and anxiety/depression after last office visit 7/14  HPI:  Katherine Robbins is a 33 y.o. female with pertinent PMH of hypertension, anxiety, depression, and headaches who presents for follow-up for her aforementioned conditions.  Please see problem-based assessment plan for further HPI.    Review of Systems  Constitutional:  Negative for chills and fever.  Eyes:  Positive for photophobia and pain.  Respiratory:  Negative for cough.   Cardiovascular:  Negative for chest pain and palpitations.  Gastrointestinal:  Negative for nausea and vomiting.  Psychiatric/Behavioral:  Positive for depression. Negative for hallucinations and suicidal ideas. The patient is nervous/anxious.     Medications: Current Outpatient Medications  Medication Instructions   escitalopram  (LEXAPRO ) 20 mg, Oral, Daily   olmesartan -hydrochlorothiazide (BENICAR  HCT) 40-25 MG tablet 1 tablet, Oral, Daily   SUMAtriptan  (IMITREX ) 50 mg, Oral, Once PRN, May repeat in 2 hours if headache persists or recurs.     Physical Exam:  Vitals:   07/01/24 0950  BP: 117/77  Pulse: 77  Temp: 98.2 F (36.8 C)  TempSrc: Oral  SpO2: 100%  Weight: 159 lb 9.6 oz (72.4 kg)  Height: 5' (1.524 m)    Physical Exam Constitutional:      General: She is not in acute distress.    Appearance: She is not ill-appearing.  Cardiovascular:     Rate and Rhythm: Normal rate and regular rhythm.  Pulmonary:     Effort: No respiratory distress.  Musculoskeletal:     Right lower leg: No edema.     Left lower leg: No edema.  Neurological:     Mental Status: She is alert.       Assessment & Plan:   Assessment & Plan Essential hypertension BP today significantly improved to 117/77.  Home BPs with systolic blood pressures in the 110s to 120s range. Patient reports excellent compliance with olmesartan  40 and hydrochlorothiazide 25.  She does have some dryness in her mouth which may be  because of the diuretic portion of her medication.  She was advised to maintain adequate hydration.  - Continue Olmesartan  40-Hydrochlorothiazide 25 - BMP and magnesium  to monitor Creatinine and electrolytes after increasing the above medication. Severe episode of recurrent major depressive disorder, without psychotic features (HCC) PHQ-9 17 today, increased from 10-12 at her previous last 2 visits.  GAD 21 today, increased from 8 last visit.  She states that her symptoms are somewhat unchanged from her last visit, and that her symptoms are impacting her in her day-to-day activities.  She would like to trial an increased dose of Lexapro .  She is currently taking 10 mg.  She received a call from a caseworker, however she has not been established with a counseling practice.  She was provided with the number for [facility] [phone]) to set up an appointment for counseling.   - Increase Lexapro  dose to 20 mg once daily, she may continue to take two 10 mg tablets until she runs out of the medication and then will pick up the 20 mg dose after she is done with the medication she has. Nonintractable episodic headache, unspecified headache type Her headaches have been decreasing in frequency since her last visit.  Her headache is mostly a bandlike distribution, worse in the front.  She has associated bright flashes before she has a headache.  She also has photophobia.  She denies nausea/vomiting. Her symptoms are not classic for any 1 particular headache type,  however I believe that with her photophobia, aura-like symptoms, and improvement with sumatriptan , her headaches may be migraine type. She has been using Tylenol  and sumatriptan  for headaches.  She normally uses Tylenol  as an initial agent, and has used sumatriptan  50 to 100 mg for uncontrolled headaches with Tylenol .  She has used sumatriptan  roughly 3 times per week, and it has worked every time.  She is pleased with her  current level of control of her headaches. She was advised to try to limit her use of Tylenol  and sumatriptan  to 3 times per week to avoid medication withdrawal headache.  -Continue with Tylenol  and sumatriptan .  Orders Placed This Encounter  Procedures   Basic metabolic panel with GFR   Magnesium    Follow-up in 6-8 weeks for depression, anxiety, headaches, and BP.  Patient seen with Dr. Ronnald Karna Melvenia Napoleon, MD [facility] Internal Medicine Resident PGY-1 Clinic Phone: [phone] Please contact the on call pager at [phone] for any urgent or emergent needs.

## 2024-07-02 ENCOUNTER — Ambulatory Visit: Payer: Self-pay

## 2024-07-02 LAB — BASIC METABOLIC PANEL WITH GFR
BUN/Creatinine Ratio: 18 (ref 9–23)
BUN: 17 mg/dL (ref 6–20)
CO2: 23 mmol/L (ref 20–29)
Calcium: 9.9 mg/dL (ref 8.7–10.2)
Chloride: 102 mmol/L (ref 96–106)
Creatinine, Ser: 0.97 mg/dL (ref 0.57–1.00)
Glucose: 75 mg/dL (ref 70–99)
Potassium: 5.1 mmol/L (ref 3.5–5.2)
Sodium: 141 mmol/L (ref 134–144)
eGFR: 79 mL/min/1.73 (ref >59)

## 2024-07-02 LAB — MAGNESIUM: Magnesium: 2.1 mg/dL (ref 1.6–2.3)

## 2024-07-03 NOTE — Progress Notes (Signed)
 Internal Medicine Clinic Attending  I was physically present during the key portions of the resident provided service and participated in the medical decision making of patient's management care. I reviewed pertinent patient test results.  The assessment, diagnosis, and plan were formulated together and I agree with the documentation in the resident's note.  No self-harm or SI thoughts indicated on PHQ9. Discussed limiting analgesic medication to 2-3x/week to avoid medication overuse headache.  Karna Fellows, MD

## 2024-07-29 ENCOUNTER — Other Ambulatory Visit: Payer: Self-pay

## 2024-07-29 DIAGNOSIS — F332 Major depressive disorder, recurrent severe without psychotic features: Secondary | ICD-10-CM

## 2024-07-30 ENCOUNTER — Other Ambulatory Visit: Payer: Self-pay | Admitting: Licensed Clinical Social Worker

## 2024-07-30 NOTE — Patient Instructions (Signed)
 Visit Information  Thank you for taking time to visit with me today. Please don't hesitate to contact me if I can be of assistance to you before our next scheduled appointment.  Our next appointment is by telephone on 08/27/2024 at 10:00 AM    Please call the care guide team at [phone] if you need to cancel or reschedule your appointment.   Following is a copy of your care plan:   Goals Addressed             This Visit's Progress    VBCI Social Work Care Plan       Problems:   Biomedical scientist             Mental Health Needs:  Depression issues; anxiety issues             Headaches (pain issues , severe)             Decreased family support             Weight loss over last 6 weeks            Job stress issues   CSW Clinical Goal(s):   Over the next 30  days the Patient will attend all scheduled medical appointments as evidenced by patient report and care team review of appointment completion in electronic medical record.              Over the next 30 days, client will call [facility] [phone]) to inquire about possible counseling support for [name] patient report of contact with [facility]               Interventions:  Spoke with client about medication procurement. She said she is now taking Lexapro  as prescribed. She is hoping this will help with anxiety and stress issues faced              Discussed job of client. She works as Designer, fashion/clothing. She spoke of job related stress issues.                Discussed family support. She said she has reduced family support. She spoke of support from her friend, [employer]             Discussed residence of client. She lives in local apartment with her 42 year old son. Discussed food procurement. She said she and her son have adequate food supply            Completed SDOH assessments. Completed with client PHQ 2/9 and GAD-7             Used Active  Listening skills to hear needs and feelings of client. 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 She has job stress that has not changed per client                Discussed medications procurement. Discussed pain issues faced                Discussed transport needs. She is driving her vehicle as needed.                 Provided counseling support for client.                Discussed client support with PCP               Encouraged client to call LCSW as needed  for SW support at [phone]               Client was appreciative of call from LCSW today              Patient Goals/Self-Care Activities:  Call [facility] to discuss counseling support options for client               Continue taking your medication as prescribed.                 Attend medical appointments as scheduled               Call LCSW as needed for SW support                Allow time for stress reduction activities such as listening to music, watching TV or spending quality time with her son  Plan:   Telephone follow up appointment with care management team member scheduled for:  08/27/2024 at 10:00 AM         Please go to [facility] [address] [place] [phone]) if you are experiencing a Mental Health or Behavioral Health Crisis or need someone to talk to.  The patient verbalized understanding of instructions, educational materials, and care plan provided today and DECLINED offer to receive copy of patient instructions, educational materials, and care plan.     Glendia Pear  MSW, LCSW [place]/[facility] Licensed Clinical Social Worker Direct Dial:  [phone] Fax:  [phone] Website:  [email or web address]

## 2024-07-30 NOTE — Patient Outreach (Signed)
 Complex Care Management   Visit Note  07/30/2024  Name:  Katherine Robbins MRN: [id number] DOB: 09/04/1991  Situation: Referral received for Complex Care Management related to mental health needs; and, SDOH needs I obtained verbal consent from Patient.  Visit completed with Patient  on the phone  Background:   Past Medical History:  Diagnosis Date   Bacterial vaginosis 07/06/2022   Blunt abdominal trauma 06/25/2012   Chlamydia    Infection    UTI   Normal pregnancy 06/25/2012   Normal pregnancy 06/25/2012   Ovarian cyst    S/P cesarean section 07/01/2012   S/P cesarean section 07/01/2012    Assessment: Patient Reported Symptoms:  Cognitive Cognitive Status: Alert and oriented to person, place, and time Cognitive/Intellectual Conditions Management [RPT]: None reported or documented in medical history or problem list   Health Maintenance Behaviors: Stress management, Social activities, Sleep adequate Health Facilitated by: Rest, Stress management  Neurological Neurological Review of Symptoms: Headaches, Weakness Neurological Management Strategies: Adequate rest, Coping strategies  HEENT HEENT Symptoms Reported: Nosebleed (nosebleed occasionally) HEENT Management Strategies: Adequate rest, Coping strategies    Cardiovascular Cardiovascular Symptoms Reported: Dizziness, Fatigue Does patient have uncontrolled Hypertension?: Yes Cardiovascular Management Strategies: Adequate rest, Coping strategies  Respiratory Respiratory Symptoms Reported: No symptoms reported Respiratory Management Strategies: Adequate rest, Coping strategies  Endocrine Endocrine Symptoms Reported: Nausea or vomiting, Weight loss, Weakness or fatigue Is patient diabetic?: No    Gastrointestinal Gastrointestinal Symptoms Reported: Nausea, Vomiting Additional Gastrointestinal Details: headaches with occasional nausea Gastrointestinal Management Strategies: Adequate rest, Coping strategies    Genitourinary  Genitourinary Symptoms Reported: No symptoms reported Genitourinary Management Strategies: Coping strategies  Integumentary Integumentary Symptoms Reported: No symptoms reported Skin Management Strategies: Adequate rest, Coping strategies  Musculoskeletal Musculoskelatal Symptoms Reviewed: Muscle pain, Weakness Musculoskeletal Management Strategies: Activity, Adequate rest, Medication therapy      Psychosocial Psychosocial Symptoms Reported: Anxiety - if selected complete GAD, Depression - if selected complete PHQ 2-9, Sadness - if selected complete PHQ 2-9 Additional Psychological Details: has stress issues regarding her job responsibilities Behavioral Management Strategies: Coping strategies Major Change/Loss/Stressor/Fears (CP): Medical condition, self, School or job Techniques to [employer] with Loss/Stress/Change: Counseling, Medication Quality of Family Relationships: unable to assess Do you feel physically threatened by others?: No Client has job related stress issues (ongoing)    07/30/2024    PHQ2-9 Depression Screening   Little interest or pleasure in doing things Several days  Feeling down, depressed, or hopeless Several days  PHQ-2 - Total Score 2  Trouble falling or staying asleep, or sleeping too much Several days  Feeling tired or having little energy More than half the days  Poor appetite or overeating  More than half the days  Feeling bad about yourself - or that you are a failure or have let yourself or your family down Several days  Trouble concentrating on things, such as reading the newspaper or watching television More than half the days  Moving or speaking so slowly that other people could have noticed.  Or the opposite - being so fidgety or restless that you have been moving around a lot more than usual Several days  Thoughts that you would be better off dead, or hurting yourself in some way Not at all  PHQ2-9 Total Score 11  If you checked off any problems, how  difficult have these problems made it for you to do your work, take care of things at home, or get along with other people Somewhat difficult  Depression  Interventions/Treatment Medication, Counseling    Vitals:  Client did not mention any BP problems during call with LCSW   Medications Reviewed Today   Medications were not reviewed in this encounter     Recommendation:   PCP Follow-up Continue Current Plan of Care Call [facility] to inquire about counseling support for client Take medications as prescribed  Follow Up Plan:   Telephone follow up appointment date/time:  08/27/2024 at 10:00 AM    Glendia Pear  MSW, LCSW [place]/Value Based Care [facility] Licensed Clinical Social Worker Direct Dial:  [phone] Fax:  [phone] Website:  [email or web address]

## 2024-08-08 ENCOUNTER — Telehealth: Payer: Self-pay

## 2024-08-08 ENCOUNTER — Other Ambulatory Visit: Payer: Self-pay | Admitting: Internal Medicine

## 2024-08-08 NOTE — Telephone Encounter (Signed)
 Patient called, left VM to return the call to the office. Will need to let the patient know as noted in today's refill encounter, medication was discontinued on 06/03/24.     Copied from CRM [id number]. Topic: Clinical - Prescription Issue >> Aug 08, 2024  4:11 PM Alfonso ORN wrote: Reason for CRM: pt. Have question want to know why the medication for the TRAZODONE  100 MG TABLET was denied  Please contact patient to let know

## 2024-08-08 NOTE — Telephone Encounter (Signed)
 Medication discontinued 06/03/24.

## 2024-08-09 NOTE — Telephone Encounter (Signed)
 RTC from patient requesting refill on the Trazadone to help her sleep.  Just took her last pill. Wants it to go to the [facility] on [address] if possible.

## 2024-08-09 NOTE — Telephone Encounter (Signed)
 Call to patient. Message left to call the Clinics concerning the refill on her medication.

## 2024-08-12 ENCOUNTER — Encounter: Payer: Self-pay | Admitting: Student

## 2024-08-12 ENCOUNTER — Ambulatory Visit: Admitting: Student

## 2024-08-12 VITALS — BP 159/101 | HR 68 | Temp 98.3°F | Ht 60.0 in | Wt 165.5 lb

## 2024-08-12 DIAGNOSIS — G44209 Tension-type headache, unspecified, not intractable: Secondary | ICD-10-CM

## 2024-08-12 DIAGNOSIS — G4700 Insomnia, unspecified: Secondary | ICD-10-CM

## 2024-08-12 DIAGNOSIS — F332 Major depressive disorder, recurrent severe without psychotic features: Secondary | ICD-10-CM

## 2024-08-12 DIAGNOSIS — R519 Headache, unspecified: Secondary | ICD-10-CM

## 2024-08-12 DIAGNOSIS — R0683 Snoring: Secondary | ICD-10-CM

## 2024-08-12 DIAGNOSIS — F339 Major depressive disorder, recurrent, unspecified: Secondary | ICD-10-CM | POA: Diagnosis not present

## 2024-08-12 DIAGNOSIS — I10 Essential (primary) hypertension: Secondary | ICD-10-CM | POA: Diagnosis not present

## 2024-08-12 MED ORDER — AMLODIPINE BESYLATE 5 MG PO TABS: 5.0000 mg | ORAL_TABLET | Freq: Every day | ORAL | 11 refills | Status: AC

## 2024-08-12 MED ORDER — TRAZODONE HCL 100 MG PO TABS: 100.0000 mg | ORAL_TABLET | Freq: Every day | ORAL | 11 refills | Status: AC

## 2024-08-12 MED ORDER — ESCITALOPRAM OXALATE 10 MG PO TABS: 20.0000 mg | ORAL_TABLET | Freq: Every day | ORAL | 11 refills | Status: DC

## 2024-08-12 NOTE — Assessment & Plan Note (Signed)
 STOP-BANG of 5. - Referred to sleep studies

## 2024-08-12 NOTE — Patient Instructions (Addendum)
 Thank you, [name] for allowing us  to provide your care today. Today we discussed   Insomnia: Things we know help with this: Limitation of time in bed Reduction of stimulation before bed Cognitive restructuring or changing negative thoughts about being deprived of sleep Sleep hygiene Refraining from eating or drinking alcohol or caffeine before bed Going to bed at the same time each night Relaxation techniques such as mindfulness  New medications: - I am glad trazodone  100 mg at nighttime.  - I decreased the amount of Lexapro  you are taking.  Please pick up the 10 mg pills. - Watch out for serotonin syndrome:  agitation, tremor, jumpy legs, excessive sweating, fever  For your blood pressure I am also adding amlodipine  5 mg daily for blood pressure Make sure that you are taking the olmesartan -hydrochlorothiazide 40 mg - 25 mg daily Measure your blood pressure daily, 1-2 hours AFTER taking your meds  I am also referring you to the sleep doctor.   I suspect you have sleep apnea and this is not helping with your lack of sleep.  I have written some information about this below:  The long-term risks and ramifications of untreated moderate to severe obstructive sleep apnea may include (but are not limited to):  - increased risk for cardiovascular disease, including congestive heart failure, stroke, difficult to control hypertension - treatment resistant obesity, arrhythmias, especially irregular heartbeat commonly known as A. Fib. (atrial fibrillation) - even type 2 diabetes has been linked to untreated OSA.    Other correlations that untreated obstructive sleep apnea include macular edema which is swelling of the retina in the eyes, droopy eyelid syndrome, and elevated hemoglobin and hematocrit levels (often referred to as polycythemia).   Sleep apnea can cause disruption of sleep and sleep deprivation in most cases, which, in turn, can cause recurrent headaches, problems with  memory, mood, concentration, focus, and vigilance. Most people with untreated sleep apnea report excessive daytime sleepiness, which can affect their ability to drive. Please do not drive or use heavy equipment or machinery if you feel sleepy! Patients with sleep apnea can also develop difficulty initiating and maintaining sleep (aka insomnia).    Having sleep apnea may increase your risk for other sleep disorders, including involuntary behaviors sleep such as sleep terrors, sleep talking, sleepwalking.     Having sleep apnea can also increase your risk for restless leg syndrome and leg movements at night.    Please note that untreated obstructive sleep apnea may carry additional perioperative morbidity. Patients with significant obstructive sleep apnea (typically, in the moderate to severe degree) should receive, if possible, perioperative PAP (positive airway pressure) therapy and the surgeons and particularly the anesthesiologists should be informed of the diagnosis and the severity of the sleep disordered breathing.  My Chart Access: [email or web address]  Please follow-up in: 4 weeks    We look forward to seeing you next time. Please call our clinic at [phone] if you have any questions or concerns. The best time to call is Monday-Friday from 9am-4pm, but there is someone available 24/7. If after hours or the weekend, call the main hospital number and ask for the Internal Medicine Resident On-Call. If you need medication refills, please notify your pharmacy one week in advance and they will send us  a request.   Thank you for letting us  take part in your care. Wishing you the best!  Elnora Ip, MD 08/12/2024, 10:01 AM [facility]

## 2024-08-12 NOTE — Progress Notes (Signed)
 Subjective:  CC: insomnia  HPI:  [name] is a 33 y.o. female with a past medical history stated below and presents today for insomnia. Please see problem based assessment and plan for additional details.  Past Medical History:  Diagnosis Date   Bacterial vaginosis 07/06/2022   Blunt abdominal trauma 06/25/2012   Chlamydia    Infection    UTI   Normal pregnancy 06/25/2012   Normal pregnancy 06/25/2012   Ovarian cyst    S/P cesarean section 07/01/2012   S/P cesarean section 07/01/2012    Current Outpatient Medications on File Prior to Visit  Medication Sig Dispense Refill   olmesartan -hydrochlorothiazide (BENICAR  HCT) 40-25 MG tablet Take 1 tablet by mouth daily. 90 tablet 3   SUMAtriptan  (IMITREX ) 50 MG tablet Take 1 tablet (50 mg total) by mouth once as needed for migraine. May repeat in 2 hours if headache persists or recurs. 30 tablet 2   No current facility-administered medications on file prior to visit.    Family History  Problem Relation Age of Onset   Hypertension Mother    Kidney disease Father    Hypertension Maternal Grandfather    Diabetes Paternal Grandmother    Kidney disease Paternal Grandmother    Anesthesia problems Neg Hx     Social History   Socioeconomic History   Marital status: Single    Spouse name: Not on file   Number of children: Not on file   Years of education: Not on file   Highest education level: Not on file  Occupational History   Not on file  Tobacco Use   Smoking status: Every Day    Types: Cigarettes   Smokeless tobacco: Never   Tobacco comments:    Quit 06/21/22  Vaping Use   Vaping status: Every Day  Substance and Sexual Activity   Alcohol use: Yes   Drug use: No   Sexual activity: Yes    Birth control/protection: None  Other Topics Concern   Not on file  Social History Narrative   Not on file   Social Drivers of Health   Financial Resource Strain: Not on file  Food Insecurity: No Food  Insecurity (06/18/2024)   Hunger Vital Sign    Worried About Running Out of Food in the Last Year: Never true    Ran Out of Food in the Last Year: Never true  Transportation Needs: No Transportation Needs (06/18/2024)   PRAPARE - Administrator, Civil Service (Medical): No    Lack of Transportation (Non-Medical): No  Physical Activity: Not on file  Stress: Stress Concern Present (07/30/2024)   [employer] of Occupational Health - Occupational Stress Questionnaire    Feeling of Stress: To some extent  Social Connections: Not on file  Intimate Partner Violence: Not At Risk (06/18/2024)   Humiliation, Afraid, Rape, and Kick questionnaire    Fear of Current or Ex-Partner: No    Emotionally Abused: No    Physically Abused: No    Sexually Abused: No    Review of Systems: ROS negative except for what is noted on the assessment and plan.  Objective:   Vitals:   08/12/24 1002 08/12/24 1029  BP: (!) 150/91 (!) 159/101  Pulse: 75 68  Temp: 98.3 F (36.8 C)   TempSrc: Oral   SpO2: 100%   Weight: 165 lb 8 oz (75.1 kg)   Height: 5' (1.524 m)     Physical Exam: Constitutional: well-appearing woman sitting in chair, in  no acute distress HENT: normocephalic atraumatic, mucous membranes moist Eyes: conjunctiva non-erythematous Neck: supple Cardiovascular: regular rate and rhythm, no m/r/g, no lower extremity edema, radial, dorsalis pedis and posterior tibialis pulses symmetric Pulmonary/Chest: normal work of breathing on room air, lungs clear to auscultation bilaterally Abdominal: soft, non-tender, non-distended MSK: normal bulk and tone Neurological: alert & oriented x 3,, normal gait Skin: warm and dry Psych: Pleasant mood and affect Assessment & Plan:   Insomnia Patient has been struggling with insomnia for about 10 years as documented below.  Previously on trazodone  100 mg daily until about August 1 of this year.  Without trazodone  patient usually requires about 80  mg of melatonin to sleep.  Insomnia was well-controlled with trazodone  100 before able to sleep through the night.  Hair problem is maintenance now onset.  We discussed sleep hygiene, which the patient knows about and he is adherent to.  Screened for sleep apnea with a STOP-BANG score of 5.  No symptoms of restless legs.  Today would add on to her therapy trazodone  100 mg daily.  Will decrease her Lexapro , for depression, to 10 mg daily.  Reviewed signs and symptoms of serotonin syndrome.  It seems like she has been on both Lexapro  10 mg and trazodone  at the same time without side effects. -Will need to return to clinic in about 4 weeks for monitoring of insomnia -Sleep hygiene reinforced -Restart trazodone  100 mg at night, for insomnia -See depression elsewhere -Continue therapy -Advised against high doses of melatonin  Major depression Chronic, recurrent.  Today's PHQ-9 score was elevated above 11 mostly because of altered sleeping, tired and decreased energy all of which could be from only sleeping about 2 hours/day.  Since starting Lexapro  her mood, lack of motivation, lack of concentration, and decreased daily in the mornings have improved significantly.  Will need to monitor this symptoms as we decreased the dose of Lexapro  to allow for trazodone  therapy for insomnia. - Decrease Lexapro  to 10 mg daily -See addition of trazodone  for chronic insomnia  Snoring STOP-BANG of 5. - Referred to sleep studies  Uncontrolled hypertension Previously controlled now uncontrolled multiple lifestyle factors probably contributing.  Patient took her medications this morning.  Has been taking the higher dose of olmesartan  hydrochlorothiazide.  Asymptomatic, no lower extremity edema  Vitals:   08/12/24 1002 08/12/24 1029  BP: (!) 150/91 (!) 159/101   -Will add amlodipine  today, 5 mg daily -Screening for sleep apnea today, referred -If uncontrolled at next visit will need further secondary  hypertension workup and medication up titration  Headache Well-controlled thus far.  Using sumatriptan  about 2 times per week.  It seems that her sleep has not affected the headaches as much.  Will continue to monitor for the specific symptom now with the reduction of Lexapro , which could be helping with the headaches as well.    Return in about 4 weeks (around 09/09/2024), or if symptoms worsen or fail to improve, for Depression, insominia (new medication), and secondary blood pressure work up.  Patient discussed with Dr. Lovie Hadassah Kristy Rosario, MD [facility]  08/12/2024, 11:57 AM

## 2024-08-12 NOTE — Assessment & Plan Note (Signed)
 Well-controlled thus far.  Using sumatriptan  about 2 times per week.  It seems that her sleep has not affected the headaches as much.  Will continue to monitor for the specific symptom now with the reduction of Lexapro , which could be helping with the headaches as well.

## 2024-08-12 NOTE — Assessment & Plan Note (Signed)
 Patient has been struggling with insomnia for about 10 years as documented below.  Previously on trazodone  100 mg daily until about August 1 of this year.  Without trazodone  patient usually requires about 80 mg of melatonin to sleep.  Insomnia was well-controlled with trazodone  100 before able to sleep through the night.  Hair problem is maintenance now onset.  We discussed sleep hygiene, which the patient knows about and he is adherent to.  Screened for sleep apnea with a STOP-BANG score of 5.  No symptoms of restless legs.  Today would add on to her therapy trazodone  100 mg daily.  Will decrease her Lexapro , for depression, to 10 mg daily.  Reviewed signs and symptoms of serotonin syndrome.  It seems like she has been on both Lexapro  10 mg and trazodone  at the same time without side effects. -Will need to return to clinic in about 4 weeks for monitoring of insomnia -Sleep hygiene reinforced -Restart trazodone  100 mg at night, for insomnia -See depression elsewhere -Continue therapy -Advised against high doses of melatonin

## 2024-08-12 NOTE — Assessment & Plan Note (Signed)
 Chronic, recurrent.  Today's PHQ-9 score was elevated above 11 mostly because of altered sleeping, tired and decreased energy all of which could be from only sleeping about 2 hours/day.  Since starting Lexapro  her mood, lack of motivation, lack of concentration, and decreased daily in the mornings have improved significantly.  Will need to monitor this symptoms as we decreased the dose of Lexapro  to allow for trazodone  therapy for insomnia. - Decrease Lexapro  to 10 mg daily -See addition of trazodone  for chronic insomnia

## 2024-08-12 NOTE — Assessment & Plan Note (Addendum)
 Previously controlled now uncontrolled multiple lifestyle factors probably contributing.  Patient took her medications this morning.  Has been taking the higher dose of olmesartan  hydrochlorothiazide.  Asymptomatic, no lower extremity edema  Vitals:   08/12/24 1002 08/12/24 1029  BP: (!) 150/91 (!) 159/101   -Will add amlodipine  today, 5 mg daily -Screening for sleep apnea today, referred -If uncontrolled at next visit will need further secondary hypertension workup and medication up titration

## 2024-08-13 NOTE — Progress Notes (Signed)
 Internal Medicine Clinic Attending  Case discussed with the resident at the time of the visit.  We reviewed the resident's history and exam and pertinent patient test results.  I agree with the assessment, diagnosis, and plan of care documented in the resident's note.

## 2024-08-27 ENCOUNTER — Other Ambulatory Visit: Payer: Self-pay | Admitting: Licensed Clinical Social Worker

## 2024-09-09 ENCOUNTER — Ambulatory Visit: Admitting: Student

## 2024-09-09 ENCOUNTER — Other Ambulatory Visit: Payer: Self-pay | Admitting: Student

## 2024-09-09 DIAGNOSIS — F332 Major depressive disorder, recurrent severe without psychotic features: Secondary | ICD-10-CM

## 2024-09-09 NOTE — Telephone Encounter (Signed)
 Pharmacy requesting a 90 day supply

## 2024-11-18 DIAGNOSIS — N898 Other specified noninflammatory disorders of vagina: Secondary | ICD-10-CM | POA: Diagnosis not present

## 2024-11-18 DIAGNOSIS — B3731 Acute candidiasis of vulva and vagina: Secondary | ICD-10-CM | POA: Diagnosis not present

## 2024-11-18 DIAGNOSIS — Z3202 Encounter for pregnancy test, result negative: Secondary | ICD-10-CM | POA: Diagnosis not present

## 2024-11-18 DIAGNOSIS — I10 Essential (primary) hypertension: Secondary | ICD-10-CM | POA: Diagnosis not present

## 2024-11-18 DIAGNOSIS — R300 Dysuria: Secondary | ICD-10-CM | POA: Diagnosis not present

## 2024-11-25 ENCOUNTER — Emergency Department (HOSPITAL_COMMUNITY)
Admission: EM | Admit: 2024-11-25 | Discharge: 2024-11-25 | Discharge status: Against Medical Advice | Source: Ambulatory Visit | Attending: Emergency Medicine | Admitting: Emergency Medicine

## 2024-11-25 ENCOUNTER — Ambulatory Visit: Payer: Self-pay

## 2024-11-25 ENCOUNTER — Encounter (HOSPITAL_COMMUNITY): Payer: Self-pay | Admitting: Emergency Medicine

## 2024-11-25 ENCOUNTER — Other Ambulatory Visit: Payer: Self-pay

## 2024-11-25 DIAGNOSIS — I10 Essential (primary) hypertension: Secondary | ICD-10-CM | POA: Insufficient documentation

## 2024-11-25 DIAGNOSIS — R040 Epistaxis: Secondary | ICD-10-CM | POA: Diagnosis not present

## 2024-11-25 DIAGNOSIS — Z5321 Procedure and treatment not carried out due to patient leaving prior to being seen by health care provider: Secondary | ICD-10-CM | POA: Insufficient documentation

## 2024-11-25 NOTE — ED Provider Triage Note (Cosign Needed)
 Emergency Medicine Provider Triage Evaluation Note  Katherine Robbins , a 33 y.o. female  was evaluated in triage.  Pt complains of nosebleed. Endorse having abdominal discomfort with nausea and vomiting throughout the day today.  Also report nosebleed from both nares earlier today that has improved.  Sts BP has been high.  Pt have been taking medications for her cold sxs recently.    Review of Systems  Positive: As above Negative: As above  Physical Exam  BP (!) 172/112   Pulse 84   Temp 99.2 F (37.3 C) (Oral)   Resp 16   LMP 10/31/2024 (Exact Date)   SpO2 98%  Gen:   Awake, no distress   Resp:  Normal effort  MSK:   Moves extremities without difficulty  Other:  No active nosebleed  Medical Decision Making  Medically screening exam initiated at 5:39 PM.  Appropriate orders placed.  Katherine Robbins was informed that the remainder of the evaluation will be completed by another provider, this initial triage assessment does not replace that evaluation, and the importance of remaining in the ED until their evaluation is complete.     Nivia Colon, PA-C 11/25/24 1742

## 2024-11-25 NOTE — Telephone Encounter (Signed)
 FYI Only or Action Required?: FYI only for provider: ED advised.  Patient was last seen in primary care on 08/12/2024 by Elnora Ip, MD.  Called Nurse Triage reporting Hypertension and Epistaxis.  Symptoms began today.  Interventions attempted: Other: urgent care; compress, direct pressure applied.  Symptoms are: gradually worsening.  Triage Disposition: Go to ED Now (Notify PCP)  Patient/caregiver understands and will follow disposition?: Yes              Copied from CRM #[id number]. Topic: Clinical - Red Word Triage >> Nov 25, 2024  1:27 PM Debby BROCKS wrote: Red Word that prompted transfer to Nurse Triage: Left UC just now due to severe nose bleeds with 164/117 blood pressure. As she left she started bleeding again and was advised to contact PCPs office to see if she needs to go to ER or make an appointment. Reason for Disposition  [1] Bleeding present > 30 minutes AND [2] using correct method of direct pressure  Answer Assessment - Initial Assessment Questions Patient states she has been having a nosebleed since 9am this morning. She states she has been able to get it to stop for 10-15 minutes but then it starts back up again. She was seen at urgent care, provider applied a compress and direct pressure held per patient. She states  urgent care provider told her if it started bleeding again to go to ED. She states it has started bleeding again. RN advised for someone else to drive her to ED while he applied direct pressure holding her nose. Patient is agreeable.  Protocols used: Nosebleed-A-AH

## 2024-11-25 NOTE — ED Triage Notes (Signed)
 Patient sent from [facility] for intermittent nose bleeds. She's been having this problem for the past 3 days but today it hgas gotten worse she also states that her blood pressure is high. She has a history of hypertension,she is compliant with medications.

## 2024-11-25 NOTE — ED Triage Notes (Signed)
 When asked pt stated that she has been out of her BP meds for a couple of days just got them refilled today but has not taken them yet.  She has also been taking some cold medications d/t cold symptoms x 5 days.
